# Patient Record
Sex: Female | Born: 1990 | Race: Black or African American | Hispanic: No | Marital: Single | Smoking: Current every day smoker
Health system: Southern US, Community
[De-identification: ages and names within clinical notes are randomized; demographics above are authoritative.]

## PROBLEM LIST (undated history)

## (undated) DIAGNOSIS — F32A Depression, unspecified: Secondary | ICD-10-CM

## (undated) DIAGNOSIS — J45909 Unspecified asthma, uncomplicated: Secondary | ICD-10-CM

## (undated) DIAGNOSIS — F419 Anxiety disorder, unspecified: Secondary | ICD-10-CM

## (undated) DIAGNOSIS — I10 Essential (primary) hypertension: Secondary | ICD-10-CM

## (undated) HISTORY — DX: Anxiety disorder, unspecified: F41.9

## (undated) HISTORY — DX: Depression, unspecified: F32.A

## (undated) HISTORY — PX: TONSILLECTOMY: SUR1361

---
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
## 2010-11-19 ENCOUNTER — Emergency Department (HOSPITAL_COMMUNITY)
Admission: EM | Admit: 2010-11-19 | Discharge: 2010-11-19 | Disposition: A | Discharge status: Home / Self Care | Payer: Self-pay | Attending: Emergency Medicine | Admitting: Emergency Medicine

## 2010-11-19 DIAGNOSIS — H53149 Visual discomfort, unspecified: Secondary | ICD-10-CM | POA: Insufficient documentation

## 2010-11-19 DIAGNOSIS — R112 Nausea with vomiting, unspecified: Secondary | ICD-10-CM | POA: Insufficient documentation

## 2010-11-19 DIAGNOSIS — R51 Headache: Secondary | ICD-10-CM | POA: Insufficient documentation

## 2010-11-27 ENCOUNTER — Emergency Department (HOSPITAL_COMMUNITY)
Admission: EM | Admit: 2010-11-27 | Discharge: 2010-11-27 | Disposition: A | Discharge status: Home / Self Care | Payer: Self-pay | Attending: Emergency Medicine | Admitting: Emergency Medicine

## 2010-11-27 DIAGNOSIS — M542 Cervicalgia: Secondary | ICD-10-CM | POA: Insufficient documentation

## 2010-11-27 DIAGNOSIS — M436 Torticollis: Secondary | ICD-10-CM | POA: Insufficient documentation

## 2010-11-27 MED ORDER — METAXALONE 800 MG PO TABS: 800.0000 mg | ORAL_TABLET | Freq: Three times a day (TID) | ORAL | Status: AC

## 2010-11-27 MED ORDER — PREDNISONE 10 MG PO TABS: ORAL_TABLET | ORAL | Status: DC

## 2010-11-27 MED ORDER — HYDROCODONE-ACETAMINOPHEN 5-500 MG PO TABS: 1.0000 | ORAL_TABLET | ORAL | Status: AC | PRN

## 2010-11-27 NOTE — Progress Notes (Signed)
Pt driving. Rx given.

## 2010-11-27 NOTE — ED Notes (Signed)
Pt reports waking a week ago with a stiffness and spasms in her neck that is not getting any better.  nad noted

## 2010-11-27 NOTE — ED Notes (Signed)
Pt presents with stiff neck and neck pain. Pt states pain is on both sides of neck and pt is unable to turn neck. Pt denies trauma. Pt has tried home remedies with no relief.

## 2010-11-27 NOTE — ED Provider Notes (Signed)
History     Chief Complaint  Patient presents with  . Torticollis   Patient is a 20 y.o. female presenting with neck injury.  Neck Injury This is a new problem. The current episode started in the past 7 days. The problem occurs constantly. The problem has been unchanged. Associated symptoms include neck pain. Pertinent negatives include no abdominal pain, arthralgias, chest pain, coughing, fever, myalgias or numbness. The symptoms are aggravated by twisting. She has tried nothing for the symptoms. The treatment provided no relief.    History reviewed. No pertinent past medical history.  Past Surgical History  Procedure Date  . Tonsillectomy     History reviewed. No pertinent family history.  History  Substance Use Topics  . Smoking status: Current Everyday Smoker -- 0.5 packs/day    Types: Cigarettes  . Smokeless tobacco: Not on file  . Alcohol Use: Yes    OB History    Grav Para Term Preterm Abortions TAB SAB Ect Mult Living   0               Review of Systems  Constitutional: Negative for fever and activity change.       All ROS Neg except as noted in HPI  HENT: Positive for neck pain. Negative for nosebleeds.   Eyes: Negative for photophobia and discharge.  Respiratory: Negative for cough, shortness of breath and wheezing.   Cardiovascular: Negative for chest pain and palpitations.  Gastrointestinal: Negative for abdominal pain and blood in stool.  Genitourinary: Negative for dysuria, frequency and hematuria.  Musculoskeletal: Negative for myalgias, back pain and arthralgias.  Skin: Negative.   Neurological: Negative for dizziness, seizures, speech difficulty and numbness.  Psychiatric/Behavioral: Negative for hallucinations and confusion.    Physical Exam  BP 154/85  Pulse 77  Temp(Src) 98.7 F (37.1 C) (Oral)  Resp 16  Ht 5\' 4"  (1.626 m)  Wt 209 lb (94.802 kg)  BMI 35.87 kg/m2  SpO2 99%  LMP 11/07/2010  Physical Exam  Nursing note and vitals  reviewed. Constitutional: She is oriented to person, place, and time. She appears well-developed and well-nourished.  Non-toxic appearance.  HENT:  Head: Normocephalic.  Right Ear: Tympanic membrane and external ear normal.  Left Ear: Tympanic membrane and external ear normal.  Eyes: EOM and lids are normal. Pupils are equal, round, and reactive to light.  Neck: Neck supple. Carotid bruit is not present.       Pt has decrease ROM of the neck due pain. Tightness of the trapezius muscles. No bruits. No lymphadenopathy.   Cardiovascular: Normal rate, regular rhythm, normal heart sounds, intact distal pulses and normal pulses.   Pulmonary/Chest: Breath sounds normal. No respiratory distress.  Abdominal: Soft. Bowel sounds are normal. There is no tenderness. There is no guarding.  Musculoskeletal: Normal range of motion.       Tightness of the shoulders with spasm on palpation.  Lymphadenopathy:       Head (right side): No submandibular adenopathy present.       Head (left side): No submandibular adenopathy present.    She has no cervical adenopathy.  Neurological: She is alert and oriented to person, place, and time. She has normal strength. No cranial nerve deficit or sensory deficit.  Skin: Skin is warm and dry.  Psychiatric: She has a normal mood and affect. Her speech is normal.    ED Course  Procedures  MDM I have reviewed nursing notes, vital signs, and all appropriate lab and imaging results for  this patient.      Kathie Dike, [place] 11/27/10 [id number]

## 2010-12-05 ENCOUNTER — Emergency Department (HOSPITAL_COMMUNITY)
Admission: EM | Admit: 2010-12-05 | Discharge: 2010-12-05 | Disposition: A | Discharge status: Home / Self Care | Payer: Self-pay | Attending: Emergency Medicine | Admitting: Emergency Medicine

## 2010-12-05 ENCOUNTER — Encounter (HOSPITAL_COMMUNITY): Payer: Self-pay | Admitting: *Deleted

## 2010-12-05 ENCOUNTER — Emergency Department (HOSPITAL_COMMUNITY): Payer: Self-pay

## 2010-12-05 DIAGNOSIS — R509 Fever, unspecified: Secondary | ICD-10-CM | POA: Insufficient documentation

## 2010-12-05 DIAGNOSIS — R1011 Right upper quadrant pain: Secondary | ICD-10-CM | POA: Insufficient documentation

## 2010-12-05 DIAGNOSIS — R7402 Elevation of levels of lactic acid dehydrogenase (LDH): Secondary | ICD-10-CM | POA: Insufficient documentation

## 2010-12-05 DIAGNOSIS — F17200 Nicotine dependence, unspecified, uncomplicated: Secondary | ICD-10-CM | POA: Insufficient documentation

## 2010-12-05 DIAGNOSIS — R112 Nausea with vomiting, unspecified: Secondary | ICD-10-CM | POA: Insufficient documentation

## 2010-12-05 DIAGNOSIS — R1084 Generalized abdominal pain: Secondary | ICD-10-CM

## 2010-12-05 DIAGNOSIS — R7401 Elevation of levels of liver transaminase levels: Secondary | ICD-10-CM | POA: Insufficient documentation

## 2010-12-05 DIAGNOSIS — R748 Abnormal levels of other serum enzymes: Secondary | ICD-10-CM

## 2010-12-05 LAB — CBC
MCH: 26.8 pg (ref 26.0–34.0)
MCV: 78.4 fL (ref 78.0–100.0)
Platelets: 175 10*3/uL (ref 150–400)
RDW: 13.2 % (ref 11.5–15.5)
WBC: 8.9 10*3/uL (ref 4.0–10.5)

## 2010-12-05 LAB — COMPREHENSIVE METABOLIC PANEL
AST: 54 U/L — ABNORMAL HIGH (ref 0–37)
Albumin: 3.6 g/dL (ref 3.5–5.2)
Calcium: 9.5 mg/dL (ref 8.4–10.5)
Creatinine, Ser: 0.6 mg/dL (ref 0.50–1.10)
Total Protein: 7.3 g/dL (ref 6.0–8.3)

## 2010-12-05 LAB — URINALYSIS, ROUTINE W REFLEX MICROSCOPIC
Nitrite: NEGATIVE
Specific Gravity, Urine: 1.01 (ref 1.005–1.030)
Urobilinogen, UA: 0.2 mg/dL (ref 0.0–1.0)

## 2010-12-05 LAB — URINE MICROSCOPIC-ADD ON

## 2010-12-05 MED ORDER — ONDANSETRON HCL 4 MG/2ML IJ SOLN
4.0000 mg | Freq: Once | INTRAMUSCULAR | Status: AC
  Administered 2010-12-05: 4 mg via INTRAVENOUS
  Filled 2010-12-05: qty 2

## 2010-12-05 MED ORDER — SODIUM CHLORIDE 0.9 % IV BOLUS (SEPSIS)
1000.0000 mL | Freq: Once | INTRAVENOUS | Status: AC
  Administered 2010-12-05: 1000 mL via INTRAVENOUS

## 2010-12-05 MED ORDER — HYDROCODONE-ACETAMINOPHEN 5-325 MG PO TABS: 1.0000 | ORAL_TABLET | Freq: Four times a day (QID) | ORAL | Status: AC | PRN

## 2010-12-05 MED ORDER — MORPHINE SULFATE 4 MG/ML IJ SOLN
4.0000 mg | Freq: Once | INTRAMUSCULAR | Status: AC
  Administered 2010-12-05: 4 mg via INTRAVENOUS
  Filled 2010-12-05: qty 1

## 2010-12-05 MED ORDER — HYDROMORPHONE HCL 1 MG/ML IJ SOLN
1.0000 mg | Freq: Once | INTRAMUSCULAR | Status: AC
  Administered 2010-12-05: 1 mg via INTRAVENOUS
  Filled 2010-12-05: qty 1

## 2010-12-05 MED ORDER — SODIUM CHLORIDE 0.9 % IV SOLN
Freq: Once | INTRAVENOUS | Status: AC
  Administered 2010-12-05: 08:00:00 via INTRAVENOUS

## 2010-12-05 NOTE — ED Provider Notes (Signed)
History     Chief Complaint  Patient presents with  . Fever  . Abdominal Pain  . Nausea   Patient is a 20 y.o. female presenting with abdominal pain. The history is provided by the patient.  Abdominal Pain The primary symptoms of the illness include abdominal pain, fever, nausea and vomiting. The primary symptoms of the illness do not include shortness of breath, diarrhea, dysuria, vaginal discharge or vaginal bleeding. The current episode started yesterday. The onset of the illness was gradual.  The abdominal pain is located in the RUQ. The abdominal pain is relieved by nothing.  The maximum temperature recorded prior to her arrival was unknown.  Nausea began today.  The vomiting began today. Vomiting occurs 2 to 5 times per day. The emesis contains stomach contents.  Significant associated medical issues do not include gallstones.    History reviewed. No pertinent past medical history.  Past Surgical History  Procedure Date  . Tonsillectomy     Family History  Problem Relation Age of Onset  . Adopted: Yes    History  Substance Use Topics  . Smoking status: Current Everyday Smoker -- 0.5 packs/day    Types: Cigarettes  . Smokeless tobacco: Not on file  . Alcohol Use: Yes    OB History    Grav Para Term Preterm Abortions TAB SAB Ect Mult Living   0               Review of Systems  Constitutional: Positive for fever.  Respiratory: Negative for shortness of breath.   Gastrointestinal: Positive for nausea, vomiting and abdominal pain. Negative for diarrhea.  Genitourinary: Negative for dysuria, vaginal bleeding and vaginal discharge.  All other systems reviewed and are negative.    Physical Exam  BP 138/83  Pulse 95  Temp(Src) 100.8 F (38.2 C) (Oral)  Resp 16  Ht 5\' 4"  (1.626 m)  Wt 209 lb (94.802 kg)  BMI 35.87 kg/m2  SpO2 97%  LMP 11/07/2010  Physical Exam  Nursing note and vitals reviewed. Constitutional: She is oriented to person, place, and time.  She appears well-developed and well-nourished. No distress.  HENT:  Head: Normocephalic and atraumatic.  Eyes: EOM are normal.  Neck: Normal range of motion.  Cardiovascular: Normal rate, regular rhythm and normal heart sounds.   Pulmonary/Chest: Effort normal and breath sounds normal.  Abdominal: Soft. She exhibits no distension.       Tenderness in right upper quadrant without guarding . No RLQ tenderness   Musculoskeletal: Normal range of motion.  Neurological: She is alert and oriented to person, place, and time.  Skin: Skin is warm and dry.  Psychiatric: She has a normal mood and affect. Judgment normal.    ED Course  Procedures  MDM   Results for orders placed during the hospital encounter of 12/05/10  PREGNANCY, URINE      Component Value Range   Preg Test, Ur NEGATIVE    URINALYSIS, ROUTINE W REFLEX MICROSCOPIC      Component Value Range   Color, Urine YELLOW  YELLOW    Appearance CLEAR  CLEAR    Specific Gravity, Urine 1.010  1.005 - 1.030    pH 6.5  5.0 - 8.0    Glucose, UA NEGATIVE  NEGATIVE (mg/dL)   Hgb urine dipstick TRACE (*) NEGATIVE    Bilirubin Urine NEGATIVE  NEGATIVE    Ketones, ur NEGATIVE  NEGATIVE (mg/dL)   Protein, ur NEGATIVE  NEGATIVE (mg/dL)   Urobilinogen, UA 0.2  0.0 - 1.0 (mg/dL)   Nitrite NEGATIVE  NEGATIVE    Leukocytes, UA NEGATIVE  NEGATIVE   URINE MICROSCOPIC-ADD ON      Component Value Range   Squamous Epithelial / LPF RARE  RARE    WBC, UA 0-2  <3 (WBC/hpf)   RBC / HPF 0-2  <3 (RBC/hpf)   Bacteria, UA RARE  RARE   CBC      Component Value Range   WBC 8.9  4.0 - 10.5 (K/uL)   RBC 4.85  3.87 - 5.11 (MIL/uL)   Hemoglobin 13.0  12.0 - 15.0 (g/dL)   HCT 16.1  09.6 - 04.5 (%)   MCV 78.4  78.0 - 100.0 (fL)   MCH 26.8  26.0 - 34.0 (pg)   MCHC 34.2  30.0 - 36.0 (g/dL)   RDW 40.9  81.1 - 91.4 (%)   Platelets 175  150 - 400 (K/uL)  COMPREHENSIVE METABOLIC PANEL      Component Value Range   Sodium 135  135 - 145 (mEq/L)   Potassium  3.7  3.5 - 5.1 (mEq/L)   Chloride 101  96 - 112 (mEq/L)   CO2 26  19 - 32 (mEq/L)   Glucose, Bld 107 (*) 70 - 99 (mg/dL)   BUN 9  6 - 23 (mg/dL)   Creatinine, Ser 7.82  0.50 - 1.10 (mg/dL)   Calcium 9.5  8.4 - 95.6 (mg/dL)   Total Protein 7.3  6.0 - 8.3 (g/dL)   Albumin 3.6  3.5 - 5.2 (g/dL)   AST 54 (*) 0 - 37 (U/L)   ALT 82 (*) 0 - 35 (U/L)   Alkaline Phosphatase 165 (*) 39 - 117 (U/L)   Total Bilirubin 0.4  0.3 - 1.2 (mg/dL)   GFR calc non Af Amer >60  >60 (mL/min)   GFR calc Af Amer >60  >60 (mL/min)  LIPASE, BLOOD      Component Value Range   Lipase 20  11 - 59 (U/L)   No results found.  8:43 AM Concern for GB pathology. Will obtain US to evaluate further. Mild elevation in LFTs. Pain and nausea treated. Care to Dr Estell Harpin.       Lyanne Co, MD 12/05/10 [phone]

## 2010-12-05 NOTE — ED Notes (Signed)
Pt resting on stretcher. Still c/o pain 9/10 in rt lower abdomen and in shoulders. IV infusing with no edema or redness. Alert and oriented x 3. Skin warm and dry. Color pink. Breath sounds clear and equal bilaterally. Abdomen soft and nondistended.

## 2010-12-05 NOTE — ED Notes (Signed)
Pt c/o fever x 24hrs and right sided abdominal pain. Pt took advil around 8pm last night.

## 2010-12-05 NOTE — ED Notes (Signed)
Pt dozing. States that the pain is tolerable right now. IV infusing with no edema or redness. Back from x-ray. Awaiting results.

## 2010-12-05 NOTE — ED Notes (Signed)
IV infusing with no edema or redness. Pt states that her pain is better as long as she lies still. Awaiting [place].

## 2010-12-05 NOTE — ED Provider Notes (Signed)
History     Chief Complaint  Patient presents with  . Fever  . Abdominal Pain  . Nausea   HPI  History reviewed. No pertinent past medical history.  Past Surgical History  Procedure Date  . Tonsillectomy     Family History  Problem Relation Age of Onset  . Adopted: Yes    History  Substance Use Topics  . Smoking status: Current Everyday Smoker -- 0.5 packs/day    Types: Cigarettes  . Smokeless tobacco: Not on file  . Alcohol Use: Yes    OB History    Grav Para Term Preterm Abortions TAB SAB Ect Mult Living   0               Review of Systems  Physical Exam  BP 145/85  Pulse 97  Temp(Src) 98.6 F (37 C) (Oral)  Resp 19  Ht 5\' 4"  (1.626 m)  Wt 209 lb (94.802 kg)  BMI 35.87 kg/m2  SpO2 95%  LMP 11/07/2010  Physical Exam  ED Course  Procedures Results for orders placed during the hospital encounter of 12/05/10  PREGNANCY, URINE      Component Value Range   Preg Test, Ur NEGATIVE    URINALYSIS, ROUTINE W REFLEX MICROSCOPIC      Component Value Range   Color, Urine YELLOW  YELLOW    Appearance CLEAR  CLEAR    Specific Gravity, Urine 1.010  1.005 - 1.030    pH 6.5  5.0 - 8.0    Glucose, UA NEGATIVE  NEGATIVE (mg/dL)   Hgb urine dipstick TRACE (*) NEGATIVE    Bilirubin Urine NEGATIVE  NEGATIVE    Ketones, ur NEGATIVE  NEGATIVE (mg/dL)   Protein, ur NEGATIVE  NEGATIVE (mg/dL)   Urobilinogen, UA 0.2  0.0 - 1.0 (mg/dL)   Nitrite NEGATIVE  NEGATIVE    Leukocytes, UA NEGATIVE  NEGATIVE   URINE MICROSCOPIC-ADD ON      Component Value Range   Squamous Epithelial / LPF RARE  RARE    WBC, UA 0-2  <3 (WBC/hpf)   RBC / HPF 0-2  <3 (RBC/hpf)   Bacteria, UA RARE  RARE   CBC      Component Value Range   WBC 8.9  4.0 - 10.5 (K/uL)   RBC 4.85  3.87 - 5.11 (MIL/uL)   Hemoglobin 13.0  12.0 - 15.0 (g/dL)   HCT 45.4  09.8 - 11.9 (%)   MCV 78.4  78.0 - 100.0 (fL)   MCH 26.8  26.0 - 34.0 (pg)   MCHC 34.2  30.0 - 36.0 (g/dL)   RDW 14.7  82.9 - 56.2 (%)   Platelets 175  150 - 400 (K/uL)  COMPREHENSIVE METABOLIC PANEL      Component Value Range   Sodium 135  135 - 145 (mEq/L)   Potassium 3.7  3.5 - 5.1 (mEq/L)   Chloride 101  96 - 112 (mEq/L)   CO2 26  19 - 32 (mEq/L)   Glucose, Bld 107 (*) 70 - 99 (mg/dL)   BUN 9  6 - 23 (mg/dL)   Creatinine, Ser 1.30  0.50 - 1.10 (mg/dL)   Calcium 9.5  8.4 - 86.5 (mg/dL)   Total Protein 7.3  6.0 - 8.3 (g/dL)   Albumin 3.6  3.5 - 5.2 (g/dL)   AST 54 (*) 0 - 37 (U/L)   ALT 82 (*) 0 - 35 (U/L)   Alkaline Phosphatase 165 (*) 39 - 117 (U/L)   Total Bilirubin 0.4  0.3 - 1.2 (mg/dL)   GFR calc non Af Amer >60  >60 (mL/min)   GFR calc Af Amer >60  >60 (mL/min)  LIPASE, BLOOD      Component Value Range   Lipase 20  11 - 59 (U/L)   US Abdomen Complete  12/05/2010  *RADIOLOGY REPORT*  Clinical Data:  Right upper quadrant abdominal pain.  Nausea.  ABDOMINAL ULTRASOUND COMPLETE  Comparison:  None.  Findings:  Gallbladder:  No gallstones, gallbladder wall thickening, or pericholecystic fluid.  Common Bile Duct:  Within normal limits in caliber. Measures 5 mm in diameter.  Liver: No focal mass lesion identified.  Within normal limits in parenchymal echogenicity.  IVC:  Appears normal.  Pancreas:  No abnormality identified.  Spleen:  Within normal limits in size and echotexture.  Right kidney:  Normal in size and parenchymal echogenicity.  No evidence of mass or hydronephrosis.  Left kidney:  Normal in size and parenchymal echogenicity.  No evidence of mass or hydronephrosis.  Abdominal Aorta:  No aneurysm identified.  IMPRESSION: Negative abdominal ultrasound.  Original Report Authenticated By: Danae Orleans, M.D.     MDM Results discussed and plan.     Benny Lennert, MD 12/05/10 1058

## 2010-12-05 NOTE — ED Notes (Signed)
IV infusing with no edema or redness. Medicated IV for c/o pain.

## 2011-02-14 LAB — DIFFERENTIAL
Basophils Absolute: 0
Basophils Relative: 0
Eosinophils Absolute: 0.4
Eosinophils Relative: 5
Monocytes Absolute: 0.6
Monocytes Relative: 8
Neutro Abs: 5

## 2011-02-14 LAB — DRUGS OF ABUSE SCREEN W/O ALC, ROUTINE URINE
Barbiturate Quant, Ur: NEGATIVE
Creatinine,U: 116.8
Marijuana Metabolite: NEGATIVE
Methadone: NEGATIVE

## 2011-02-14 LAB — RPR: RPR Ser Ql: NONREACTIVE

## 2011-02-14 LAB — BASIC METABOLIC PANEL
CO2: 27
Calcium: 9.5
Chloride: 104
Glucose, Bld: 79
Sodium: 138

## 2011-02-14 LAB — HEPATIC FUNCTION PANEL
ALT: 15
Alkaline Phosphatase: 84
Bilirubin, Direct: 0.1
Indirect Bilirubin: 0.6
Total Bilirubin: 0.7

## 2011-02-14 LAB — URINE MICROSCOPIC-ADD ON

## 2011-02-14 LAB — CBC
HCT: 36.6
Hemoglobin: 12.4
MCHC: 33.8
MCV: 79.3
RDW: 12.4

## 2011-02-14 LAB — TSH: TSH: 1.422

## 2011-02-14 LAB — URINALYSIS, ROUTINE W REFLEX MICROSCOPIC
Glucose, UA: NEGATIVE
Nitrite: NEGATIVE
Protein, ur: NEGATIVE
pH: 6

## 2011-03-14 ENCOUNTER — Emergency Department: Payer: Self-pay | Admitting: Unknown Physician Specialty

## 2011-04-08 ENCOUNTER — Emergency Department: Payer: Self-pay | Admitting: Emergency Medicine

## 2011-09-28 ENCOUNTER — Observation Stay: Payer: Self-pay | Admitting: Internal Medicine

## 2011-10-09 ENCOUNTER — Observation Stay: Payer: Self-pay | Admitting: Obstetrics and Gynecology

## 2011-10-15 ENCOUNTER — Observation Stay: Payer: Self-pay | Admitting: Advanced Practice Midwife

## 2011-10-16 ENCOUNTER — Inpatient Hospital Stay: Payer: Self-pay | Admitting: Obstetrics & Gynecology

## 2011-10-16 LAB — DRUG SCREEN, URINE
Barbiturates, Ur Screen: NEGATIVE (ref ?–200)
Benzodiazepine, Ur Scrn: NEGATIVE (ref ?–200)
MDMA (Ecstasy)Ur Screen: NEGATIVE (ref ?–500)
Methadone, Ur Screen: NEGATIVE (ref ?–300)
Opiate, Ur Screen: NEGATIVE (ref ?–300)

## 2011-10-16 LAB — CBC WITH DIFFERENTIAL/PLATELET
Basophil #: 0 10*3/uL (ref 0.0–0.1)
Basophil %: 0.2 %
Eosinophil %: 1.7 %
HGB: 11.3 g/dL — ABNORMAL LOW (ref 12.0–16.0)
Lymphocyte #: 1.9 10*3/uL (ref 1.0–3.6)
Lymphocyte %: 14 %
MCHC: 34 g/dL (ref 32.0–36.0)
Monocyte #: 0.7 x10 3/mm (ref 0.2–0.9)
Monocyte %: 4.7 %
Neutrophil #: 10.9 10*3/uL — ABNORMAL HIGH (ref 1.4–6.5)
Platelet: 371 10*3/uL (ref 150–440)
WBC: 13.7 10*3/uL — ABNORMAL HIGH (ref 3.6–11.0)

## 2011-12-17 IMAGING — US ABDOMEN
1 series · 14 of 25 positions shown · non-contrast
Comparison: None.

CLINICAL DATA: Right upper quadrant abdominal pain.  Nausea.

ABDOMINAL ULTRASOUND COMPLETE

[Series 1: abdomen · 0.28mm/px · 14 of 89 slices shown]
[im 1/89]
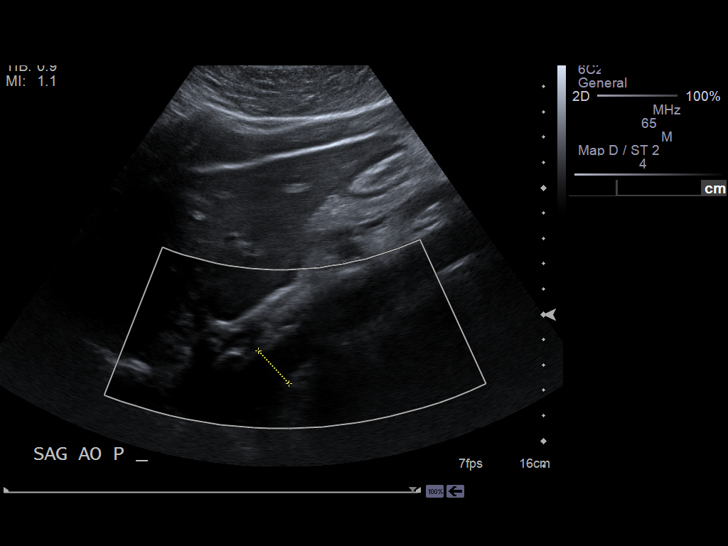
[im 8/89]
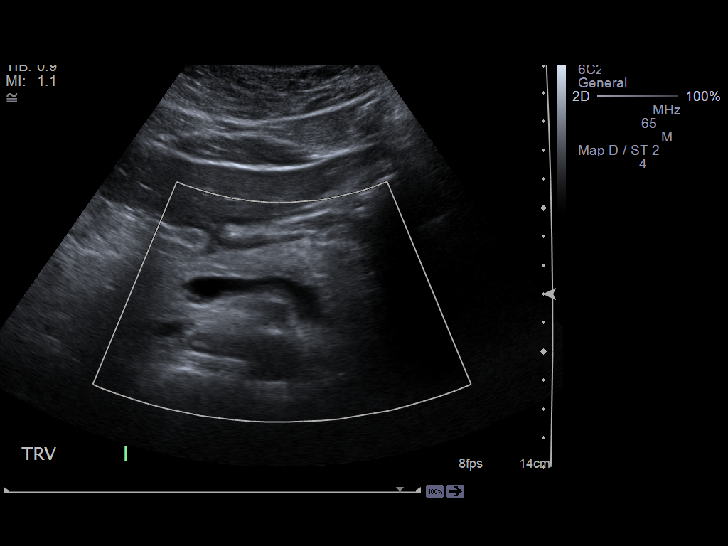
[im 15/89]
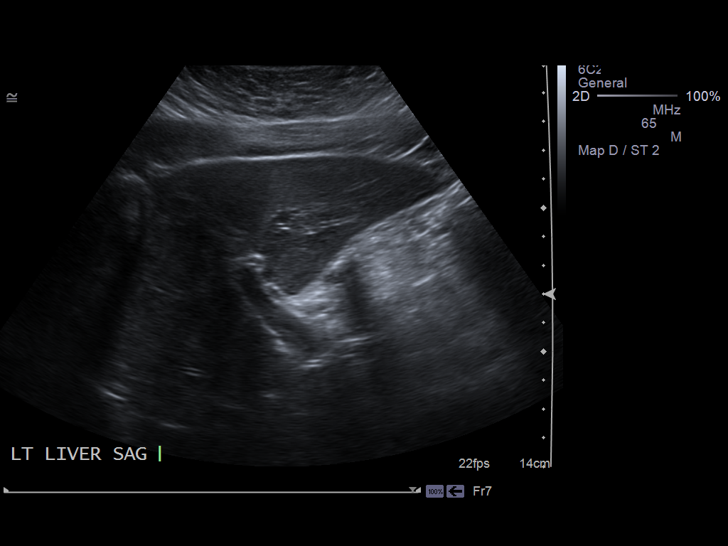
[im 23/89]
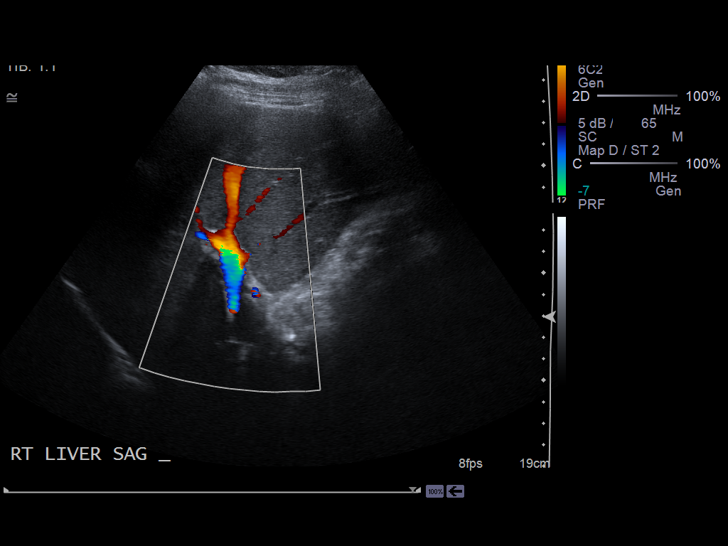
[im 30/89]
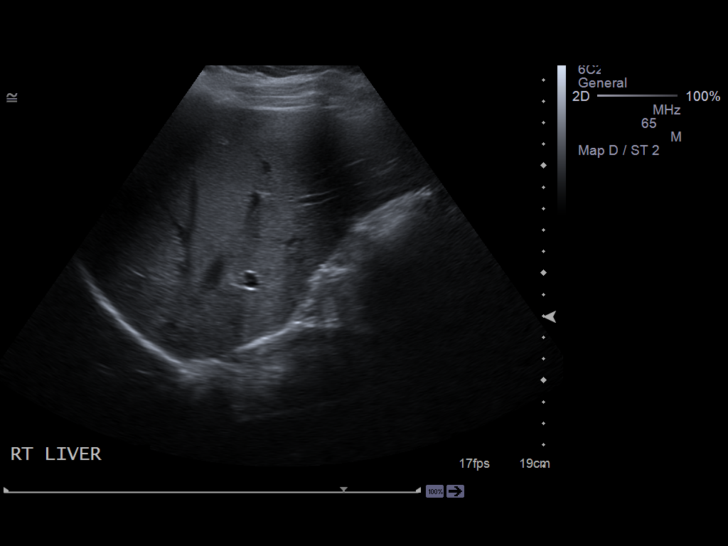
[im 34/89]
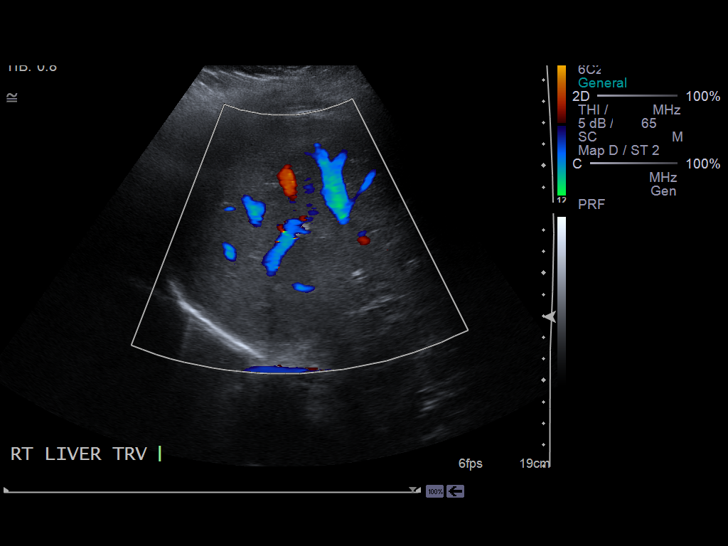
[im 41/89]
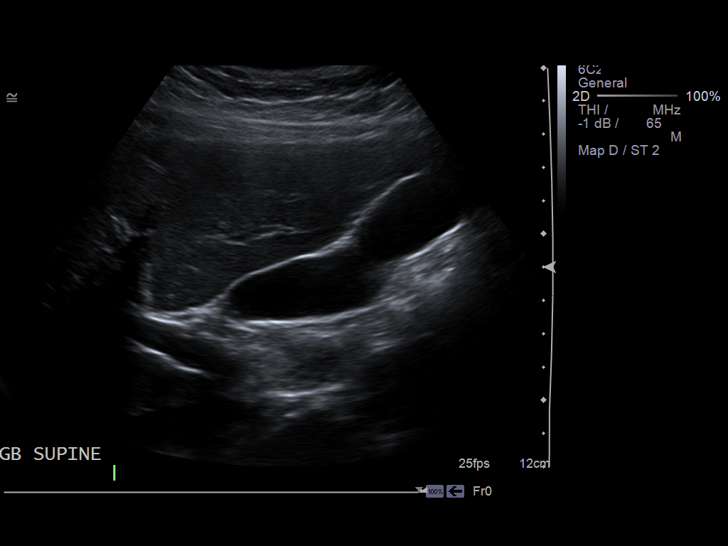
[im 48/89]
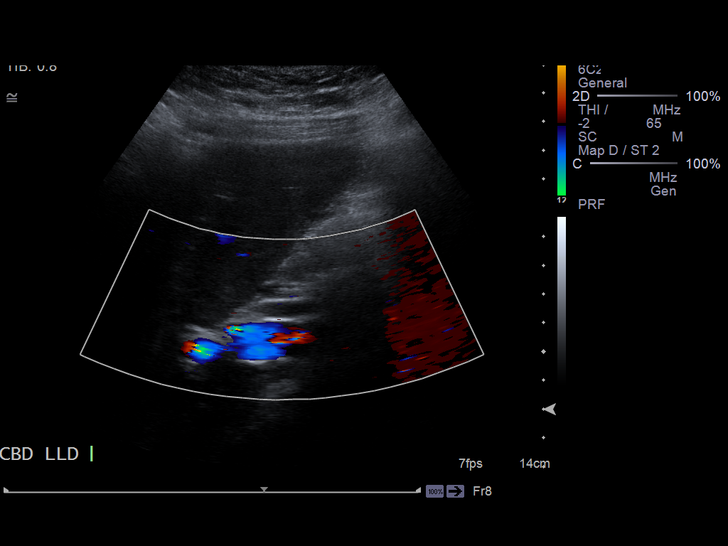
[im 56/89]
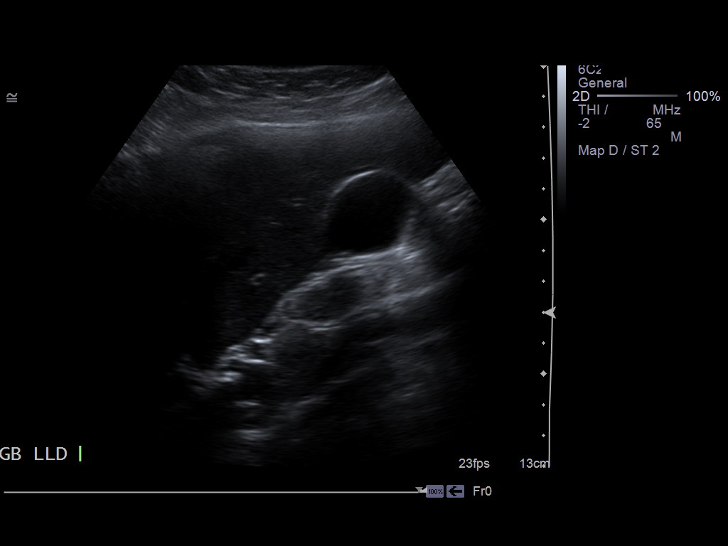
[im 59/89]
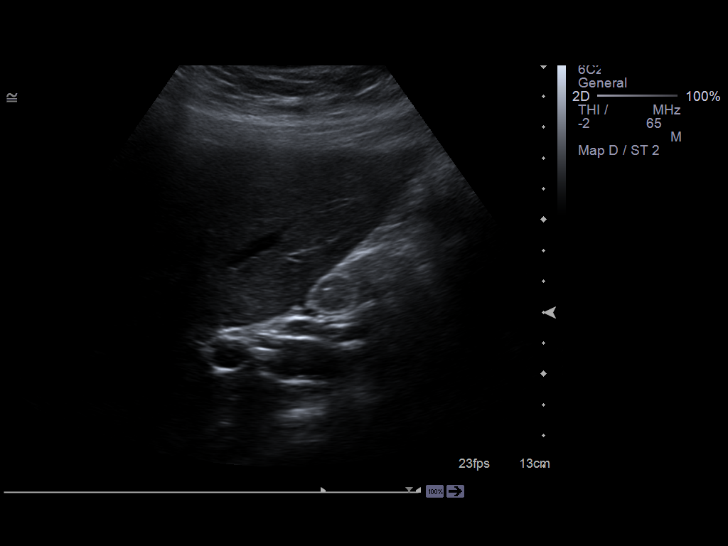
[im 67/89]
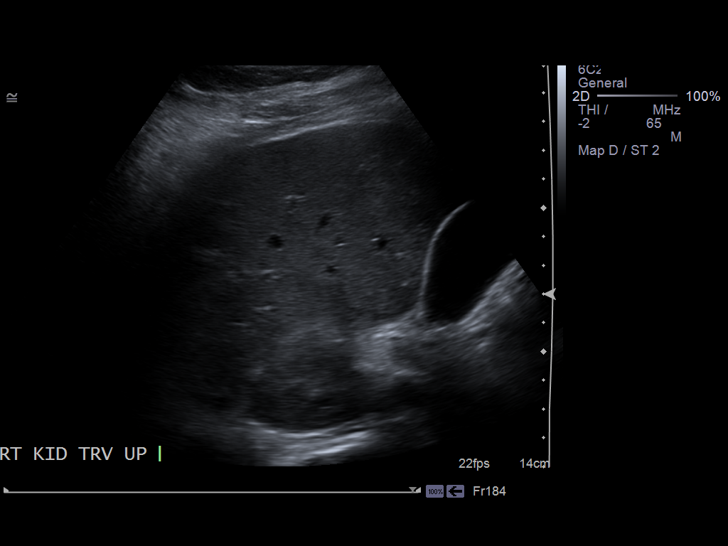
[im 74/89]
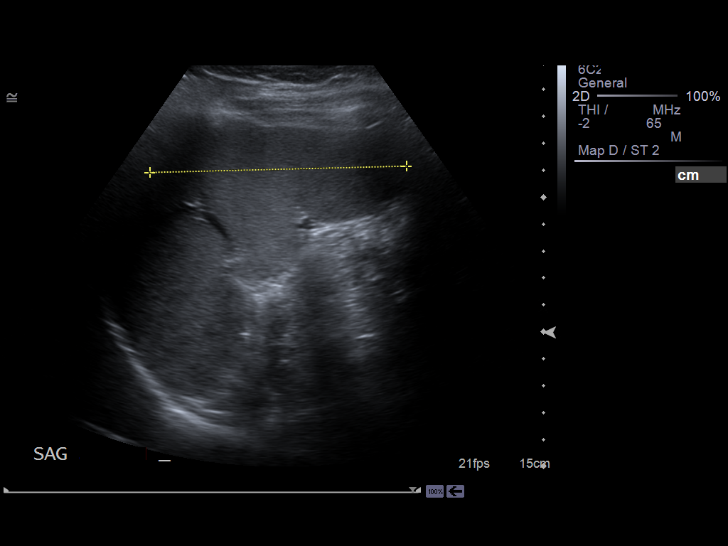
[im 81/89]
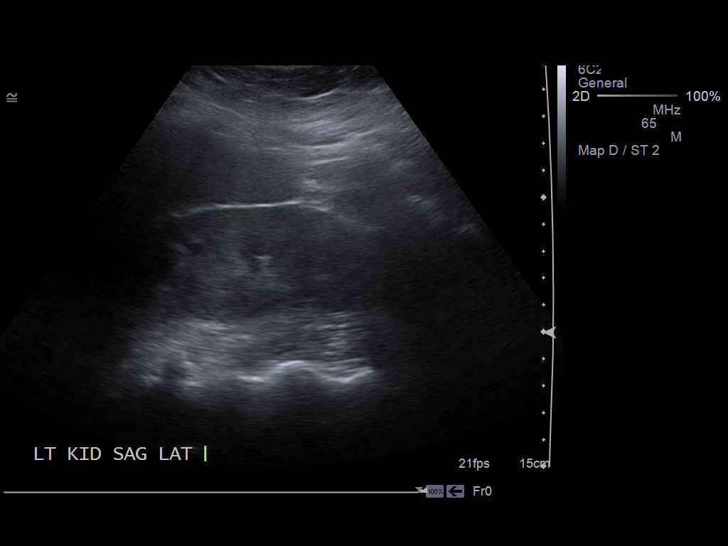
[im 89/89]
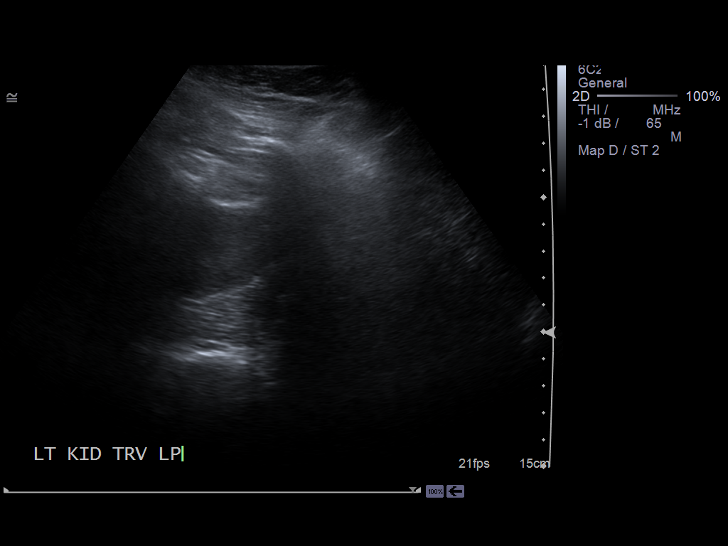

[14 of 25 positions shown; findings below may reference images not displayed]

FINDINGS: Gallbladder:  No gallstones, gallbladder wall thickening, or
pericholecystic fluid.

Common Bile Duct:  Within normal limits in caliber. Measures 5 mm
in diameter.

Liver: No focal mass lesion identified.  Within normal limits in
parenchymal echogenicity.

IVC:  Appears normal.

Pancreas:  No abnormality identified.

Spleen:  Within normal limits in size and echotexture.

Right kidney:  Normal in size and parenchymal echogenicity.  No
evidence of mass or hydronephrosis.

Left kidney:  Normal in size and parenchymal echogenicity.  No
evidence of mass or hydronephrosis.

Abdominal Aorta:  No aneurysm identified.
IMPRESSION: Negative abdominal ultrasound.

## 2012-04-24 IMAGING — US US OB < 14 WEEKS - US OB TV
1 series · 17 of 28 positions shown · non-contrast
Comparison: none

REASON FOR EXAM: Abdominal pain, vaginal bleeding
COMMENTS:   May transport without cardiac monitor

PROCEDURE:     US  - US OB LESS THAN 14 WEEKS/W TRANS  - March 14, 2011  [DATE]
RESULT:     Comparison: None
TECHNIQUE: Multiple transabdominal gray-scale images and endovaginal
gray-scale images of the pelvis performed.

[Series 1: us ob < 14 weeks - us ob tv · 17 of 76 slices shown]
[im 1/76]
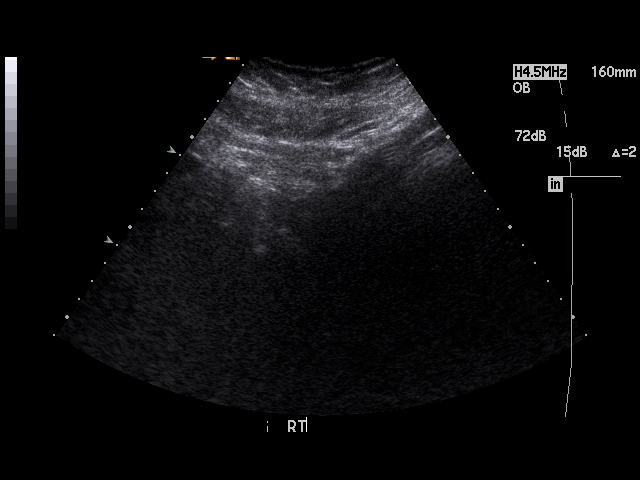
[im 6/76]
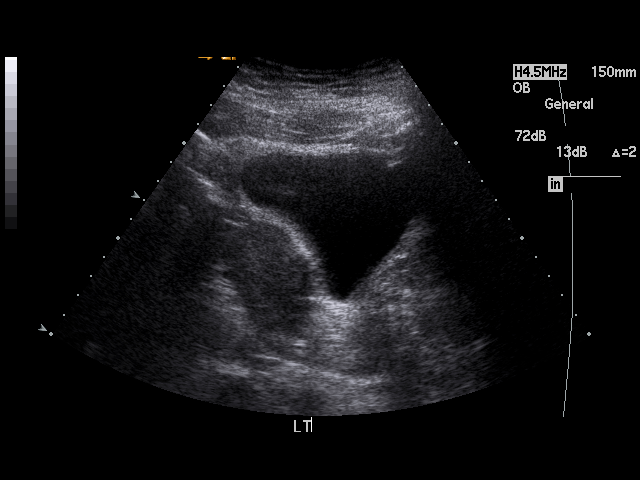
[im 12/76]
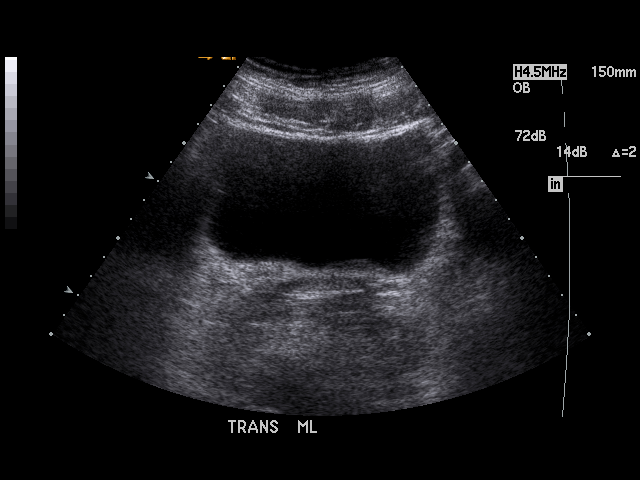
[im 14/76]
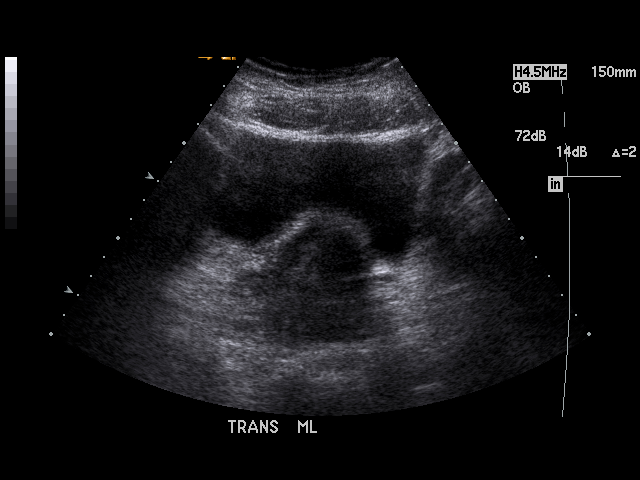
[im 20/76]
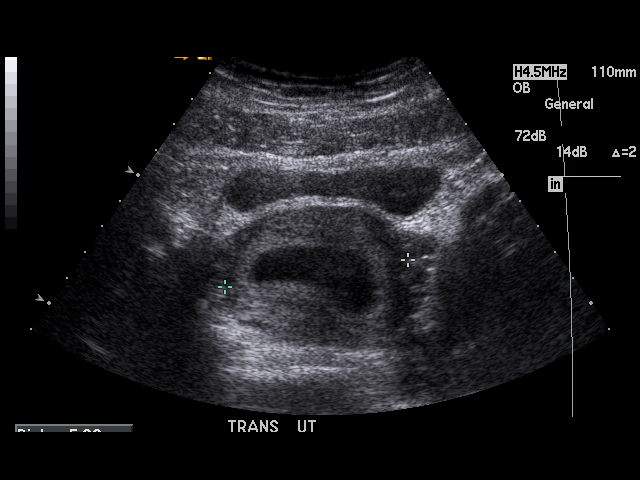
[im 26/76]
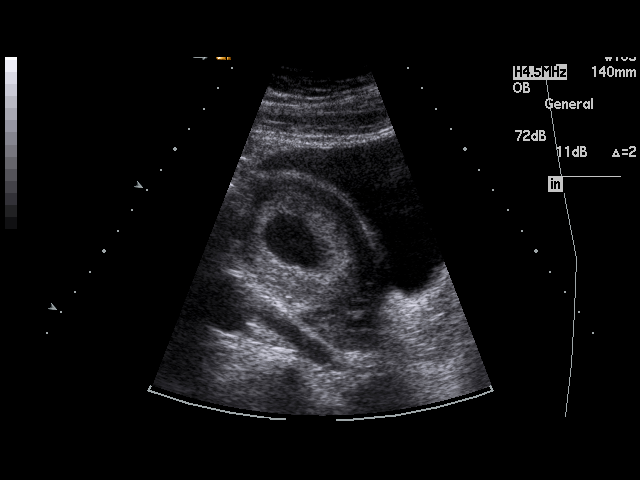
[im 28/76]
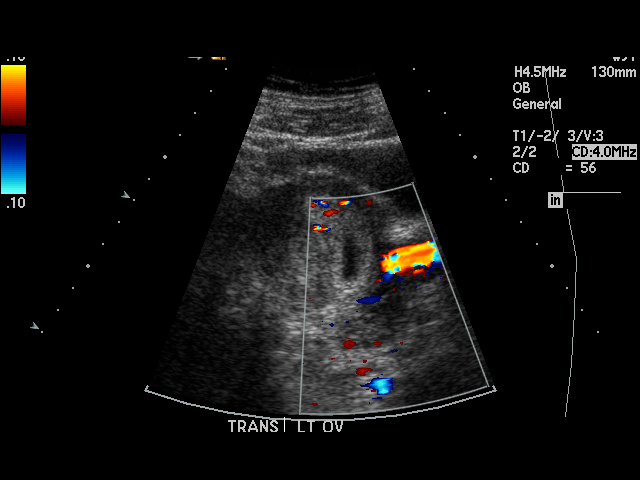
[im 34/76]
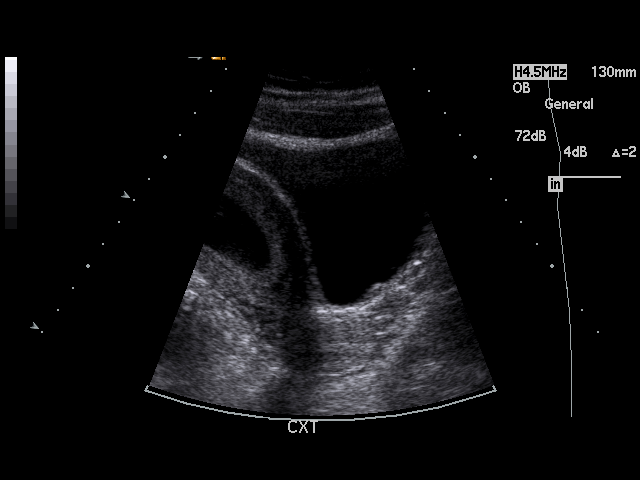
[im 39/76]
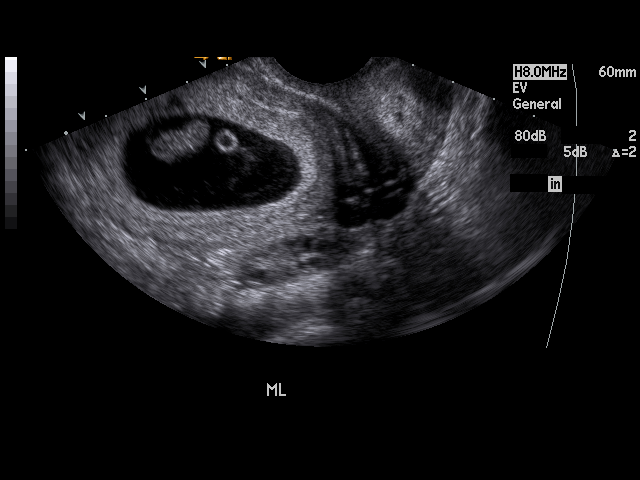
[im 42/76]
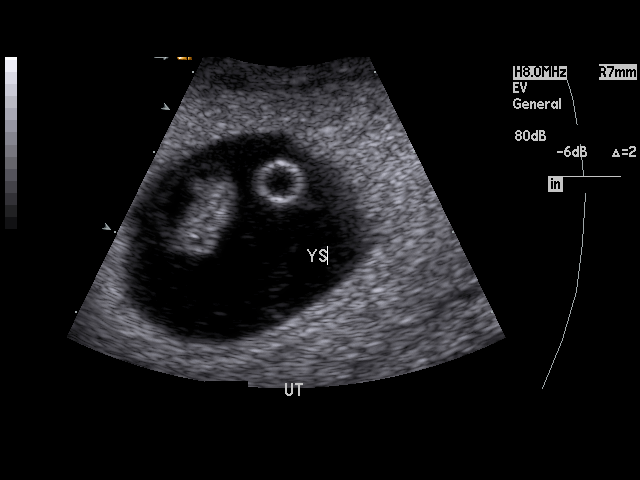
[im 48/76]
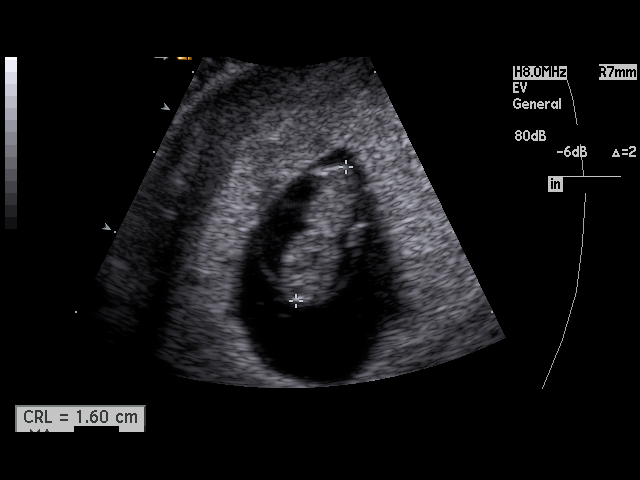
[im 51/76]
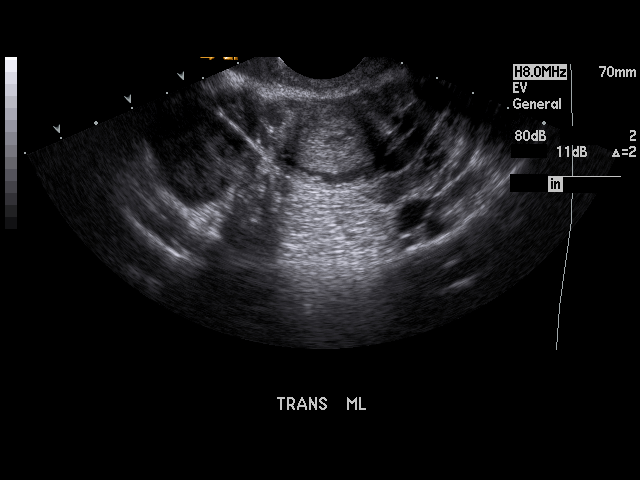
[im 56/76]
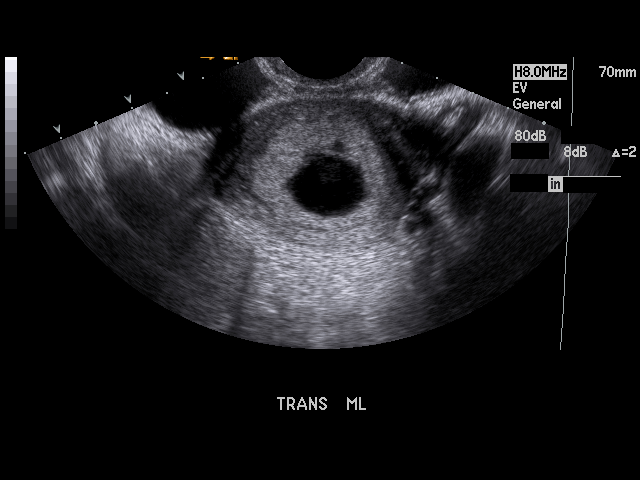
[im 62/76]
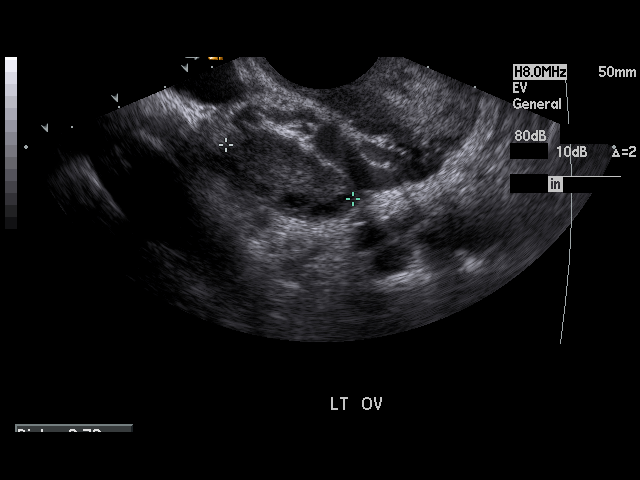
[im 64/76]
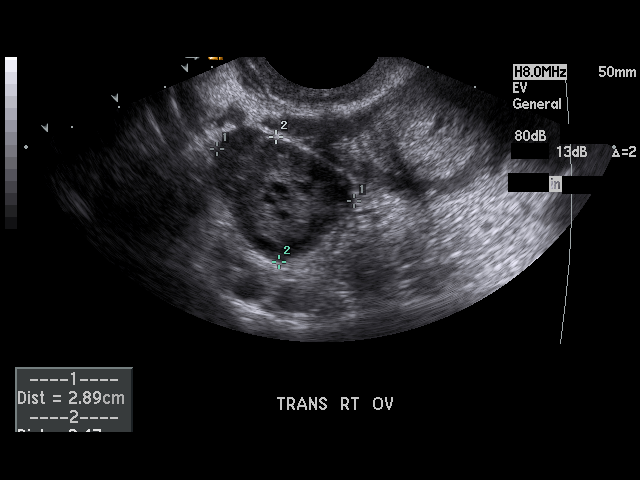
[im 70/76]
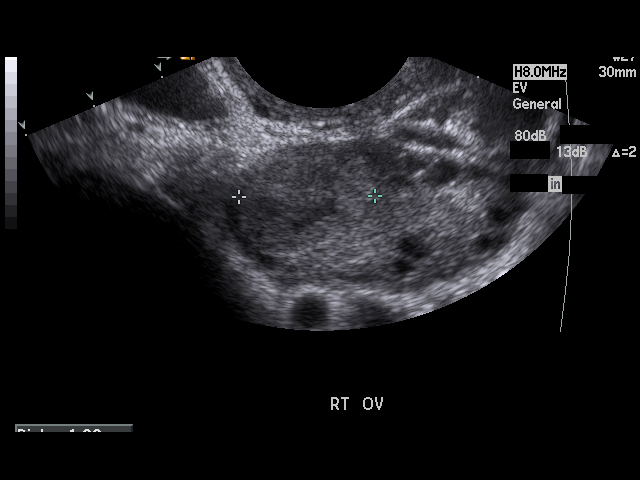
[im 76/76]
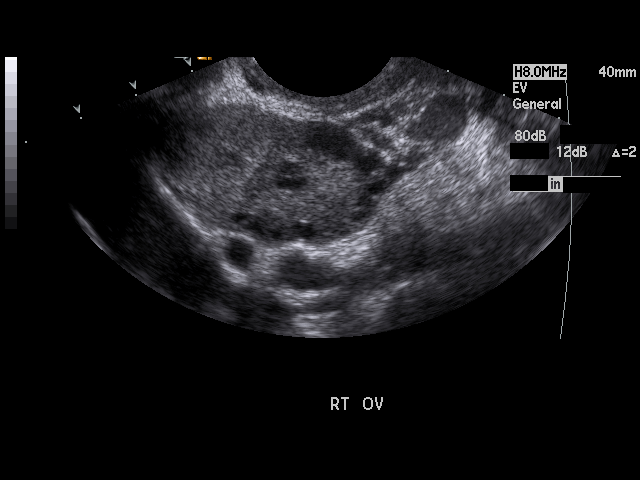

[17 of 28 positions shown; findings below may reference images not displayed]

FINDINGS: There is a single live intrauterine pregnancy visualized with a crown-rump
length of 1.59 cm dating the pregnancy at 8 weeks 0 days. There is a normal
yolk sac. There is a normal fetal heart rate of 167 beats per minute.

The right ovary measures 4.1 x 2.9 x 2.5 cm.  The left ovary measures 2.7 x
3.8 x 1.3 cm.  There is a 2.2 cm complex hypoechoic mass in the right ovary
likely resenting a corpus luteum cyst of pregnancy.

There is no pelvic free fluid.
IMPRESSION: Single live intrauterine pregnancy dating 8 weeks 0 days with an estimated
due date of 10/24/2011.

## 2013-05-09 DIAGNOSIS — O169 Unspecified maternal hypertension, unspecified trimester: Secondary | ICD-10-CM

## 2014-08-26 ENCOUNTER — Emergency Department
Admit: 2014-08-26 | Disposition: A | Discharge status: Home / Self Care | Payer: Self-pay | Admitting: Emergency Medicine

## 2014-09-23 NOTE — H&P (Signed)
L&D Evaluation:  History Expanded:   HPI 24 yo G1P0 [facility] and [facility]. SHe is 38 weeks 1 day. she is on Buspar 15 mg and PNV she is Varicella equivocal.She has borderline personality diosorder. and major depression. tubal papers have been signed but pt is not 21 yet and so the papers are invalid.    Gravida 1    Term 0    PreTerm 0    Abortion 0    Living 0    Blood Type A positive    Group B Strep Results (Result >5wks must be treated as unknown) unknown/result > 5 weeks ago  done today    Maternal HIV Negative    Maternal Syphilis Ab Nonreactive    Maternal Varicella Equivocal    Rubella Results immune    Maternal T-Dap Immune    [facility]    Presents with contractions    Patient's Medical History borderline personality disorder, major depression    Patient's Surgical History none    Medications Pre Natal Vitamins  buspar    Allergies NKDA    Social History tobacco    Family History Non-Contributory   ROS:   ROS All systems were reviewed.  HEENT, CNS, GI, GU, Respiratory, CV, Renal and Musculoskeletal systems were found to be normal.   Exam:   Vital Signs stable    General no apparent distress    Mental Status clear    Chest clear    Heart normal sinus rhythm    Abdomen gravid, non-tender    Estimated Fetal Weight Average for gestational age    Back no CVAT    Pelvic no external lesions, FT thick,    Mebranes Intact    FHT normal rate with no decels    FHT Description strictly reactive occasional contractions no decels or variabnles    Fetal Heart Rate 145    Ucx irregular    Skin dry   Impression:   Impression early labor   Plan:   Plan monitor contractions and for cervical change    Comments latent labor dc home with sleeping pill, needs to establish care in area ASAP    Follow Up Appointment need to schedule   Electronic Signatures: Adria DevonKlett,  Merric Yost (MD)  (Signed 26-May-13 22:15)  Authored: L&D Evaluation   Last Updated: 26-May-13 22:15 by Adria DevonKlett, Gerardo Caiazzo (MD)

## 2014-09-23 NOTE — H&P (Signed)
L&D Evaluation:  History:   HPI 24 year old G1P0 presents to L&D with c/o contractions. 36 weeks 5 days today with EDD 10/22/11. Pt has only had one visit to Ob/Gyn office in [place] 2 weeks ago, states didn't have Medicaid in order so [facility]. Hasn't been back to office for another visit and was "no show" to her appt on Tues due to transportation issues. Pt came here stating she has been feel abd pain/ ctx's for 4-5 hours. She did get her labs done, glucola, and anatomy scan done at her first visit. EDD per LMP 10/22/11, EDD per 34 week U/S 10/30/11.  Labs: A Pos, RI, VZ nonimmune, glucola 89, H&H 10.3/31.6, RPR NR, Hep B and HIV Neg.  Anatomy scan was incomplete due to fetal lie and gestation    Presents with abdominal pain    Patient's Medical History depression/anxiety/PTSD    Patient's Surgical History other  tonsillectomy    Medications no meds, was taking Buspar but quit    Allergies NKDA    Social History none    Family History Non-Contributory   ROS:   ROS All systems were reviewed.  HEENT, CNS, GI, GU, Respiratory, CV, Renal and Musculoskeletal systems were found to be normal.   Exam:   Vital Signs stable    Urine Protein negative dipstick    General no apparent distress    Mental Status clear    Chest clear    Abdomen gravid, non-tender    Estimated Fetal Weight Average for gestational age    Back no CVAT    Edema no edema    Pelvic no external lesions, cervix closed and thick    Mebranes Intact    FHT normal rate with no decels    Ucx absent, some irritability    Skin dry   Impression:   Impression reactive NST, 36 5/7 weeks, normal discomforts of pregnancy   Plan:   Plan EFM/NST, monitor contractions and for cervical change, discharge    Comments Expressed importance of following up with Ob/Gyn in [place], pt states transportation issues but mother should be able to provide transportation. Labor precautions given, kick count  instructions, told to return for ROM, regular ctx's, heavy bleeding or any other concerns. Pt verbalized understanding.   Electronic Signatures: Shella Maximutnam, Avry Roedl (CNM)  (Signed 15-May-13 22:14)  Authored: L&D Evaluation   Last Updated: 15-May-13 22:14 by Shella MaximPutnam, Beola Vasallo (CNM)
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
## 2014-10-04 ENCOUNTER — Emergency Department: Payer: Medicaid Other

## 2014-10-04 DIAGNOSIS — Y998 Other external cause status: Secondary | ICD-10-CM | POA: Insufficient documentation

## 2014-10-04 DIAGNOSIS — Z880 Allergy status to penicillin: Secondary | ICD-10-CM | POA: Diagnosis not present

## 2014-10-04 DIAGNOSIS — Y9389 Activity, other specified: Secondary | ICD-10-CM | POA: Insufficient documentation

## 2014-10-04 DIAGNOSIS — W108XXA Fall (on) (from) other stairs and steps, initial encounter: Secondary | ICD-10-CM | POA: Diagnosis not present

## 2014-10-04 DIAGNOSIS — S92421A Displaced fracture of distal phalanx of right great toe, initial encounter for closed fracture: Secondary | ICD-10-CM | POA: Insufficient documentation

## 2014-10-04 DIAGNOSIS — Z7952 Long term (current) use of systemic steroids: Secondary | ICD-10-CM | POA: Insufficient documentation

## 2014-10-04 DIAGNOSIS — Z720 Tobacco use: Secondary | ICD-10-CM | POA: Diagnosis not present

## 2014-10-04 DIAGNOSIS — Y9289 Other specified places as the place of occurrence of the external cause: Secondary | ICD-10-CM | POA: Insufficient documentation

## 2014-10-04 DIAGNOSIS — S99921A Unspecified injury of right foot, initial encounter: Secondary | ICD-10-CM | POA: Diagnosis present

## 2014-10-04 MED ORDER — IBUPROFEN 800 MG PO TABS
ORAL_TABLET | ORAL | Status: AC
  Administered 2014-10-04: 800 mg via ORAL
  Filled 2014-10-04: qty 1

## 2014-10-04 MED ORDER — IBUPROFEN 800 MG PO TABS
800.0000 mg | ORAL_TABLET | Freq: Once | ORAL | Status: AC
  Administered 2014-10-04: 800 mg via ORAL

## 2014-10-04 NOTE — ED Notes (Signed)
Pt states she slipped on wood stairs outside today, pt reports she slid on her left side. Pt denies LOC, denies neck pain. Pt states right foot pain.

## 2014-10-05 ENCOUNTER — Emergency Department
Admission: EM | Admit: 2014-10-05 | Discharge: 2014-10-05 | Disposition: A | Discharge status: Home / Self Care | Payer: Medicaid Other | Attending: Emergency Medicine | Admitting: Emergency Medicine

## 2014-10-05 DIAGNOSIS — S92911A Unspecified fracture of right toe(s), initial encounter for closed fracture: Secondary | ICD-10-CM

## 2014-10-05 MED ORDER — OXYCODONE-ACETAMINOPHEN 5-325 MG PO TABS: 1.0000 | ORAL_TABLET | ORAL | Status: DC | PRN

## 2014-10-05 NOTE — Discharge Instructions (Signed)
Keep foot up if you are not walking on it. He can use ice 20 minutes every hour if you are not walking on 2 please do not fall asleep with ice on the toe is you can get burns that way where a stiff shoe wear the cast boot to keep the pressure off the toe was much as possible please follow-up with Dr. Trilby DrummerManns what his phone number elsewhere on the chart call on Monday for an appointment in about a week he can take a look at it and see how that's doing a turn for the worse pain and swelling numbness or any other problems thank you

## 2014-10-05 NOTE — ED Provider Notes (Signed)
[facility] Emergency Department Provider Note  ____________________________________________  Time seen: Approximately 5:07 AM  I have reviewed the triage vital signs and the nursing notes.   HISTORY  Chief Complaint Fall     HPI Paula Keller is a 24 y.o. female patient reports she fell down a step. Complains of a lot of pain in the right great toe she denies any other injuries this happened tonight   No past medical history on file.  There are no active problems to display for this patient.   Past Surgical History  Procedure Laterality Date  . Tonsillectomy      Current Outpatient Rx  Name  Route  Sig  Dispense  Refill  . predniSONE (DELTASONE) 10 MG tablet      6,5,4,3,2,1 - take with food Patient not taking: Reported on 10/05/2014   21 tablet   0     Allergies Amoxicillin  Family History  Problem Relation Age of Onset  . Adopted: Yes    Social History History  Substance Use Topics  . Smoking status: Current Every Day Smoker -- 0.50 packs/day    Types: Cigarettes  . Smokeless tobacco: Not on file  . Alcohol Use: Yes    Review of Systems  Constitutional: No fever/chills Eyes: No visual changes. ENT: No sore throat. Cardiovascular: Denies chest pain. Respiratory: Denies shortness of breath. Gastrointestinal: No abdominal pain.  No nausea, no vomiting.  No diarrhea.  No constipation. Genitourinary: Negative for dysuria. Musculoskeletal: Negative for back pain. Skin: Negative for rash. Neurological: Negative for headaches, focal weakness or numbness.  10-point ROS otherwise negative.  ____________________________________________   PHYSICAL EXAM:  VITAL SIGNS: ED Triage Vitals  Enc Vitals Group     BP 10/04/14 2128 153/92 mmHg     Pulse Rate 10/04/14 2128 117     Resp 10/04/14 2128 22     Temp 10/04/14 2128 98.8 F (37.1 C)     Temp Source 10/04/14 2128 Oral     SpO2 10/04/14 2128 95 %     Weight  10/04/14 2128 236 lb (107.049 kg)     Height 10/04/14 2128 5\' 4"  (1.626 m)     Head Cir --      Peak Flow --      Pain Score 10/04/14 2129 8     Pain Loc --      Pain Edu? --      Excl. in GC? --     Constitutional: Alert and oriented. Well appearing and in no acute distress. Eyes: Conjunctivae are normal. PERRL. EOMI. Head: Atraumatic. Nose: No congestion/rhinnorhea. Mouth/Throat: Mucous membranes are moist.  Oropharynx non-erythematous. Neck: No stridor. Extremities are normal except for the right great toe is tender especially over the IP joint normal capillary refill sensation is intact distally Neurologic:  Normal speech and language. No gross focal neurologic deficits are appreciated. Speech is normal. No gait instability. Skin:  Skin is warm, dry and intact. No rash noted. Psychiatric: Mood and affect are normal. Speech and behavior are normal.  ____________________________________________   LABS (all labs ordered are listed, but only abnormal results are displayed)  Labs Reviewed - No data to display ____________________________________________  EKG   ____________________________________________  RADIOLOGY   ____________________________________________   PROCEDURES  Procedure(s) performed: None  Critical Care performed: No  ____________________________________________   INITIAL IMPRESSION / ASSESSMENT AND PLAN / ED COURSE  Pertinent labs & imaging results that were available during my care of the patient were reviewed  by me and considered in my medical decision making (see chart for details).  X-ray shows a fractured toe ____________________________________________   FINAL CLINICAL IMPRESSION(S) / ED DIAGNOSES  Final diagnoses:  Toe fracture, right, closed, initial encounter     Arnaldo Natal, MD 10/05/14 [phone]

## 2015-01-07 ENCOUNTER — Encounter: Payer: Self-pay | Admitting: Emergency Medicine

## 2015-01-07 ENCOUNTER — Emergency Department
Admission: EM | Admit: 2015-01-07 | Discharge: 2015-01-07 | Disposition: A | Discharge status: Home / Self Care | Payer: Medicaid Other | Attending: Emergency Medicine | Admitting: Emergency Medicine

## 2015-01-07 ENCOUNTER — Emergency Department: Payer: Medicaid Other

## 2015-01-07 DIAGNOSIS — Z720 Tobacco use: Secondary | ICD-10-CM | POA: Insufficient documentation

## 2015-01-07 DIAGNOSIS — Z880 Allergy status to penicillin: Secondary | ICD-10-CM | POA: Diagnosis not present

## 2015-01-07 DIAGNOSIS — I10 Essential (primary) hypertension: Secondary | ICD-10-CM | POA: Diagnosis not present

## 2015-01-07 DIAGNOSIS — Z79899 Other long term (current) drug therapy: Secondary | ICD-10-CM | POA: Insufficient documentation

## 2015-01-07 DIAGNOSIS — M542 Cervicalgia: Secondary | ICD-10-CM | POA: Insufficient documentation

## 2015-01-07 HISTORY — DX: Essential (primary) hypertension: I10

## 2015-01-07 MED ORDER — KETOROLAC TROMETHAMINE 60 MG/2ML IM SOLN
60.0000 mg | Freq: Once | INTRAMUSCULAR | Status: AC
  Administered 2015-01-07: 60 mg via INTRAMUSCULAR
  Filled 2015-01-07: qty 2

## 2015-01-07 MED ORDER — ETODOLAC 400 MG PO TABS: 400.0000 mg | ORAL_TABLET | Freq: Two times a day (BID) | ORAL | Status: DC

## 2015-01-07 MED ORDER — METHOCARBAMOL 500 MG PO TABS: 500.0000 mg | ORAL_TABLET | Freq: Four times a day (QID) | ORAL | Status: DC

## 2015-01-07 NOTE — ED Notes (Signed)
States she was getting a massage on sat and thought maybe it may have been too hard. Now having more pain and spasm to neck.

## 2015-01-07 NOTE — ED Notes (Signed)
Pt presents with low back pain since Saturday.

## 2015-01-07 NOTE — ED Provider Notes (Signed)
[facility] Emergency Department Provider Note ____________________________________________  Time seen: Approximately 10:25 AM  I have reviewed the triage vital signs and the nursing notes.   HISTORY  Chief Complaint Muscle Pain   HPI Paula Keller is a 24 y.o. female is here with complaint of pain. Patient states that she has had a wry neck In the past.  Saturday she had a massage and neck treatment which has left her with an extremely stiff and painful neck. She states she is having spasms which has decreased her range of motion. She's been taking Tylenol without any relief. She denies any paresthesias in her upper extremities. Currently she rates her pain 8 out of 10.   Past Medical History  Diagnosis Date  . Hypertension     There are no active problems to display for this patient.   Past Surgical History  Procedure Laterality Date  . Tonsillectomy      Current Outpatient Rx  Name  Route  Sig  Dispense  Refill  . hydrochlorothiazide (HYDRODIURIL) 25 MG tablet   Oral   Take 25 mg by mouth daily.         Marland Kitchen etodolac (LODINE) 400 MG tablet   Oral   Take 1 tablet (400 mg total) by mouth 2 (two) times daily.   20 tablet   0   . methocarbamol (ROBAXIN) 500 MG tablet   Oral   Take 1 tablet (500 mg total) by mouth 4 (four) times daily.   20 tablet   0   . oxyCODONE-acetaminophen (ROXICET) 5-325 MG per tablet   Oral   Take 1 tablet by mouth every 4 (four) hours as needed for severe pain.   20 tablet   0     Allergies Amoxicillin  Family History  Problem Relation Age of Onset  . Adopted: Yes    Social History Social History  Substance Use Topics  . Smoking status: Current Every Day Smoker -- 0.50 packs/day    Types: Cigarettes  . Smokeless tobacco: None  . Alcohol Use: Yes    Review of Systems Constitutional: No fever/chills Eyes: No visual changes. ENT: No sore throat. Cardiovascular: Denies chest  pain. Respiratory: Denies shortness of breath. Gastrointestinal: No abdominal pain.  No nausea, no vomiting. Musculoskeletal: Negative for back pain. Positive for neck pain Skin: Negative for rash. Neurological: Negative for headaches, focal weakness or numbness.  10-point ROS otherwise negative.  ____________________________________________   PHYSICAL EXAM:  VITAL SIGNS: ED Triage Vitals  Enc Vitals Group     BP 01/07/15 0954 147/95 mmHg     Pulse Rate 01/07/15 0954 75     Resp 01/07/15 0954 18     Temp 01/07/15 0954 98.3 F (36.8 C)     Temp Source 01/07/15 0954 Oral     SpO2 01/07/15 0954 98 %     Weight 01/07/15 0949 230 lb (104.327 kg)     Height 01/07/15 0949  (1.626 m)     Head Cir --      Peak Flow --      Pain Score 01/07/15 0950 8     Pain Loc --      Pain Edu? --      Excl. in GC? --     Constitutional: Alert and oriented. Well appearing and in no acute distress. Eyes: Conjunctivae are normal. PERRL. EOMI. Head: Atraumatic. Nose: No congestion/rhinnorhea. Neck: No stridor.  Moderate tenderness on palpation of cervical spine and paravertebral muscles.  Range of motion is restricted secondary to pain. Hematological/Lymphatic/Immunilogical: No cervical lymphadenopathy. Cardiovascular: Normal rate, regular rhythm. Grossly normal heart sounds.  Good peripheral circulation. Respiratory: Normal respiratory effort.  No retractions. Lungs CTAB. Gastrointestinal: Soft and nontender. No distention.  Musculoskeletal: No gross deformity as noted above on the cervical spine exam. No gross deformity noted of the thoracic and lumbar spine. Moves upper extremities without any difficulty. No lower extremity tenderness nor edema.  No joint effusions. Neurologic:  Normal speech and language. No gross focal neurologic deficits are appreciated. No gait instability. Skin:  Skin is warm, dry and intact. No rash noted. Psychiatric: Mood and affect are normal. Speech and behavior  are normal.  ____________________________________________   LABS (all labs ordered are listed, but only abnormal results are displayed)  Labs Reviewed - No data to display  RADIOLOGY  X-ray of cervical spine shows no fractures per radiologist and reviewed by me. I, Tommi Rumps, personally viewed and evaluated these images (plain radiographs) as part of my medical decision making.   ____________________________________________   PROCEDURES  Procedure(s) performed: None  Critical Care performed: No  ____________________________________________   INITIAL IMPRESSION / ASSESSMENT AND PLAN / ED COURSE  Pertinent labs & imaging results that were available during my care of the patient were reviewed by me and considered in my medical decision making (see chart for details).  She states Toradol injection did help with some of the pain. She was given a prescription for Robaxin and etodolac. She is to follow-up with her doctor in [facility] if any continued problems. ____________________________________________   FINAL CLINICAL IMPRESSION(S) / ED DIAGNOSES  Final diagnoses:  Muscle pain, cervical      Tommi Rumps, PA-C 01/07/15 1321  Sharman Cheek, MD 01/08/15 [phone]

## 2015-01-27 ENCOUNTER — Emergency Department (HOSPITAL_COMMUNITY)
Admission: EM | Admit: 2015-01-27 | Discharge: 2015-01-27 | Disposition: A | Discharge status: Home / Self Care | Payer: Medicaid Other | Attending: Emergency Medicine | Admitting: Emergency Medicine

## 2015-01-27 ENCOUNTER — Encounter (HOSPITAL_COMMUNITY): Payer: Self-pay | Admitting: Emergency Medicine

## 2015-01-27 DIAGNOSIS — Z79899 Other long term (current) drug therapy: Secondary | ICD-10-CM | POA: Insufficient documentation

## 2015-01-27 DIAGNOSIS — R05 Cough: Secondary | ICD-10-CM

## 2015-01-27 DIAGNOSIS — Z720 Tobacco use: Secondary | ICD-10-CM | POA: Diagnosis not present

## 2015-01-27 DIAGNOSIS — H7291 Unspecified perforation of tympanic membrane, right ear: Secondary | ICD-10-CM | POA: Diagnosis not present

## 2015-01-27 DIAGNOSIS — H6691 Otitis media, unspecified, right ear: Secondary | ICD-10-CM

## 2015-01-27 DIAGNOSIS — H6501 Acute serous otitis media, right ear: Secondary | ICD-10-CM | POA: Diagnosis not present

## 2015-01-27 DIAGNOSIS — Z880 Allergy status to penicillin: Secondary | ICD-10-CM | POA: Insufficient documentation

## 2015-01-27 DIAGNOSIS — J069 Acute upper respiratory infection, unspecified: Secondary | ICD-10-CM | POA: Diagnosis not present

## 2015-01-27 DIAGNOSIS — R51 Headache: Secondary | ICD-10-CM | POA: Diagnosis not present

## 2015-01-27 DIAGNOSIS — I10 Essential (primary) hypertension: Secondary | ICD-10-CM | POA: Insufficient documentation

## 2015-01-27 DIAGNOSIS — R059 Cough, unspecified: Secondary | ICD-10-CM

## 2015-01-27 DIAGNOSIS — H9201 Otalgia, right ear: Secondary | ICD-10-CM | POA: Diagnosis present

## 2015-01-27 DIAGNOSIS — J3489 Other specified disorders of nose and nasal sinuses: Secondary | ICD-10-CM

## 2015-01-27 MED ORDER — OFLOXACIN 0.3 % OT SOLN: 10.0000 [drp] | Freq: Every day | OTIC | Status: DC

## 2015-01-27 MED ORDER — CEFDINIR 300 MG PO CAPS: 300.0000 mg | ORAL_CAPSULE | Freq: Two times a day (BID) | ORAL | Status: DC

## 2015-01-27 NOTE — ED Provider Notes (Signed)
CSN: [id number]     Arrival date & time 01/27/15  4098 History  This chart was scribed for non-physician practitioner, Allen Derry, PA-C working with Donnetta Hutching, MD, by Jarvis Morgan, ED Scribe. This patient was seen in room TR05C/TR05C and the patient's care was started at 9:07 AM.     Chief Complaint  Patient presents with  . Otalgia    Patient is a 24 y.o. female presenting with ear pain. The history is provided by the patient. No language interpreter was used.  Otalgia Location:  Right Quality:  Pressure and throbbing Severity:  Moderate (7/10) Onset quality:  Gradual Duration:  2 days Timing:  Constant Progression:  Unchanged Chronicity:  New Context: not direct blow, not elevation change, not foreign body in ear, not loud noise and no water in ear   Relieved by:  Nothing Worsened by:  Nothing tried Ineffective treatments:  OTC medications Associated symptoms: cough, ear discharge, headaches, hearing loss and rhinorrhea   Associated symptoms: no abdominal pain, no diarrhea, no fever, no sore throat and no vomiting   Risk factors: no recent travel, no chronic ear infection and no prior ear surgery     HPI Comments: Paula Keller is a 24 y.o. female who presents to the Emergency Department complaining of constant, throbbing and pressure-like, 7/10, right otalgia onset 2 days, nonradiating, with no known agg/allev factors. She took Tylenol at home with no relief. She notes that she began to develop a whitish discharge from her right ear and used a q-tip and there was blood on the q-tip when she took it out. Pt reports associated cough that is productive of green sputum, clear rhinorrhea, hearing loss in R ear, and headache which is mild. Pt reports sick contacts from her family. She denies any redness behind the ear. She denies any foreign bodies or injury to the ear. She denies any recent swimming or air travel. She denies using q-tips on a regular basis. Pt is  current every day smoker with a 0.5 ppd history. Pt denies any h/o asthma. She denies any fever, chills, chest pain, SOB, sore throat, abdominal pain, nausea, vomiting, diarrhea, constipation, hematuria, dysuria, vaginal bleeding/discharge, numbness, tingling, weakness, itchy or watery eyes, or generalized myalgias.  Past Medical History  Diagnosis Date  . Hypertension    Past Surgical History  Procedure Laterality Date  . Tonsillectomy     Family History  Problem Relation Age of Onset  . Adopted: Yes   Social History  Substance Use Topics  . Smoking status: Current Every Day Smoker -- 0.50 packs/day    Types: Cigarettes  . Smokeless tobacco: None  . Alcohol Use: Yes   OB History    Gravida Para Term Preterm AB TAB SAB Ectopic Multiple Living   0              Review of Systems  Constitutional: Negative for fever and chills.  HENT: Positive for ear discharge, ear pain, hearing loss and rhinorrhea. Negative for sore throat.   Respiratory: Positive for cough. Negative for shortness of breath and wheezing.   Cardiovascular: Negative for chest pain.  Gastrointestinal: Negative for nausea, vomiting, abdominal pain, diarrhea and constipation.  Genitourinary: Negative for dysuria, hematuria and vaginal bleeding.  Musculoskeletal: Negative for myalgias and arthralgias.  Skin: Negative for color change.  Allergic/Immunologic: Negative for immunocompromised state.  Neurological: Positive for headaches. Negative for weakness and numbness.  10 Systems reviewed and all are negative for acute change except as noted  in the HPI.     Allergies  Amoxicillin  Home Medications   Prior to Admission medications   Medication Sig Start Date End Date Taking? Authorizing Provider  etodolac (LODINE) 400 MG tablet Take 1 tablet (400 mg total) by mouth 2 (two) times daily. 01/07/15   Tommi Rumps, PA-C  hydrochlorothiazide (HYDRODIURIL) 25 MG tablet Take 25 mg by mouth daily.    Historical  Provider, MD  methocarbamol (ROBAXIN) 500 MG tablet Take 1 tablet (500 mg total) by mouth 4 (four) times daily. 01/07/15   Tommi Rumps, PA-C  oxyCODONE-acetaminophen (ROXICET) 5-325 MG per tablet Take 1 tablet by mouth every 4 (four) hours as needed for severe pain. 10/05/14   Arnaldo Natal, MD   Triage Vitals: BP 142/93 mmHg  Pulse 88  Temp(Src) 98.6 F (37 C) (Oral)  Resp 18  SpO2 97%  LMP 10/15/2014  Physical Exam  Constitutional: She is oriented to person, place, and time. Vital signs are normal. She appears well-developed and well-nourished.  Non-toxic appearance. No distress.  Afebrile, nontoxic, NAD  HENT:  Head: Normocephalic and atraumatic.  Right Ear: Ear canal normal. There is drainage (bloody). No swelling or tenderness. No foreign bodies. No mastoid tenderness. Tympanic membrane is retracted. A middle ear effusion is present. There is hemotympanum. Decreased hearing is noted.  Left Ear: Hearing, tympanic membrane, external ear and ear canal normal.  Nose: Mucosal edema and rhinorrhea present.  Mouth/Throat: Uvula is midline, oropharynx is clear and moist and mucous membranes are normal. No trismus in the jaw. No uvula swelling.  Right ear with mildly diminished hearing, no pain with pinna retraction, no mastoid tenderness, with visible blood in the external ear canal appears to be draining from hemotympanum, suspected TM rupture, TM retracted with visible effusion noted. Canal with no swelling or purulent drainage, no FB. Left ear is clear. Nose with mild mucosal edema and rhinorrhea. Oropharynx clear and moist, without uvular swelling or deviation, no trismus or drooling, no tonsillar swelling or erythema, no exudates.    Eyes: Conjunctivae and EOM are normal. Pupils are equal, round, and reactive to light. Right eye exhibits no discharge. Left eye exhibits no discharge.  Neck: Normal range of motion. Neck supple.  Cardiovascular: Normal rate, regular rhythm, normal heart  sounds and intact distal pulses.  Exam reveals no gallop and no friction rub.   No murmur heard. Pulmonary/Chest: Effort normal and breath sounds normal. No respiratory distress. She has no decreased breath sounds. She has no wheezes. She has no rhonchi. She has no rales.  CTAB in all lung fields, no w/r/r, no hypoxia or increased WOB, speaking in full sentences, SpO2 97% on RA   Abdominal: Soft. Normal appearance and bowel sounds are normal. She exhibits no distension. There is no tenderness. There is no rigidity, no rebound and no guarding.  Musculoskeletal: Normal range of motion.  Neurological: She is alert and oriented to person, place, and time. She has normal strength. No sensory deficit.  Skin: Skin is warm, dry and intact. No rash noted.  Psychiatric: She has a normal mood and affect. Her behavior is normal.  Nursing note and vitals reviewed.   ED Course  Procedures (including critical care time)  DIAGNOSTIC STUDIES: Oxygen Saturation is 97% on RA, normal by my interpretation.    COORDINATION OF CARE:    Labs Review Labs Reviewed - No data to display  Imaging Review No results found. I have personally reviewed and evaluated these images and lab  results as part of my medical decision-making.   EKG Interpretation None      MDM   Final diagnoses:  Ruptured tympanic membrane, right  URI (upper respiratory infection)  Rhinorrhea  Cough  Acute right otitis media, recurrence not specified, unspecified otitis media type    24 y.o. female here with suspected ruptured TM after cold symptoms for several days. Will treat with abx drops and oral abx, f/up with ENT. Otherwise tx with OTC meds for URI symptoms. I explained the diagnosis and have given explicit precautions to return to the ER including for any other new or worsening symptoms. The patient understands and accepts the medical plan as it's been dictated and I have answered their questions. Discharge instructions  concerning home care and prescriptions have been given. The patient is STABLE and is discharged to home in good condition.   I personally performed the services described in this documentation, which was scribed in my presence. The recorded information has been reviewed and is accurate.  BP 142/93 mmHg  Pulse 88  Temp(Src) 98.6 F (37 C) (Oral)  Resp 18  SpO2 97%  LMP 10/15/2014  Meds ordered this encounter  Medications  . ofloxacin (FLOXIN) 0.3 % otic solution    Sig: Place 10 drops into the right ear daily. X 7 days    Dispense:  5 mL    Refill:  0    Order Specific Question:  Supervising Provider    Answer:  MILLER, BRIAN [[id number]]  . cefdinir (OMNICEF) 300 MG capsule    Sig: Take 1 capsule (300 mg total) by mouth 2 (two) times daily. X 7 days    Dispense:  14 capsule    Refill:  0    Order Specific Question:  Supervising Provider    Answer:  Eber Hong [[id number]]       Sophee Mckimmy Camprubi-Soms, PA-C 01/27/15 1610  Donnetta Hutching, MD 01/27/15 1240

## 2015-01-27 NOTE — Discharge Instructions (Signed)
Continue to stay well-hydrated. Continue to alternate between Tylenol and Ibuprofen for pain or fever. Use Mucinex for cough suppression/expectoration of mucus. Use netipot and flonase to help with nasal congestion. May consider over-the-counter Benadryl or other antihistamine to decrease secretions and for watery itchy eyes. Take antibiotic as directed for your ear infection, and use the antibiotic drops as directed for the ruptured ear drum. Follow up with the ENT doctor in 3-5 days for recheck of your ruptured ear drum. Avoid anything going into your ear. Return to emergency department for emergent changing or worsening of symptoms.   Eardrum Perforation The eardrum is a thin, round tissue inside the ear that separates the ear canal from the middle ear. This is the tissue that detects sound and enables you to hear. The eardrum can be punctured or torn (perforated). Eardrums generally heal without help and with little or no permanent hearing loss. CAUSES   Sudden pressure changes that happen in situations like scuba diving or flying in an airplane.  Foreign objects in the ear.  Inserting a cotton-tipped swab in the ear.  Loud noise.  Trauma to the ear. SYMPTOMS   Hearing loss.  Ear pain.  Ringing in the ears.  Discharge or bleeding from the ear.  Dizziness.  Vomiting.  Facial paralysis. HOME CARE INSTRUCTIONS   Keep your ear dry, as this improves healing. Swimming, diving, and showers are not allowed until healing is complete. While bathing, protect the ear by placing a piece of cotton covered with petroleum jelly in the outer ear canal.  Only take over-the-counter or prescription medicines for pain, discomfort, or fever as directed by your caregiver.  Blow your nose gently. Forceful blowing increases the pressure in the middle ear and may cause further injury or delay healing.  Resume normal activities, such as showering, when the perforation has healed. Your caregiver can  let you know when this has occurred.  Talk to your caregiver before flying on an airplane. Air travel is generally allowed with a perforated eardrum.  If your caregiver has given you a follow-up appointment, it is very important to keep that appointment. Failure to keep the appointment could result in a chronic or permanent injury, pain, hearing loss, and disability. SEEK IMMEDIATE MEDICAL CARE IF:   You have bleeding or pus coming from your ear.  You have problems with balance, dizziness, nausea, or vomiting.  You develop increased pain.  You have a fever. MAKE SURE YOU:   Understand these instructions.  Will watch your condition.  Will get help right away if you are not doing well or get worse. Document Released: 04/29/2000 Document Revised: 07/25/2011 Document Reviewed: 05/01/2008 [facility] Patient Information 2015 [place], [place]. This information is not intended to replace advice given to you by your health care provider. Make sure you discuss any questions you have with your health care provider.  Otitis Media Otitis media is redness, soreness, and inflammation of the middle ear. Otitis media may be caused by allergies or, most commonly, by infection. Often it occurs as a complication of the common cold. SIGNS AND SYMPTOMS Symptoms of otitis media may include:  Earache.  Fever.  Ringing in your ear.  Headache.  Leakage of fluid from the ear. DIAGNOSIS To diagnose otitis media, your health care provider will examine your ear with an otoscope. This is an instrument that allows your health care provider to see into your ear in order to examine your eardrum. Your health care provider also will ask you questions  about your symptoms. TREATMENT  Typically, otitis media resolves on its own within 3-5 days. Your health care provider may prescribe medicine to ease your symptoms of pain. If otitis media does not resolve within 5 days or is recurrent, your health care provider may  prescribe antibiotic medicines if he or she suspects that a bacterial infection is the cause. HOME CARE INSTRUCTIONS   If you were prescribed an antibiotic medicine, finish it all even if you start to feel better.  Take medicines only as directed by your health care provider.  Keep all follow-up visits as directed by your health care provider. SEEK MEDICAL CARE IF:  You have otitis media only in one ear, or bleeding from your nose, or both.  You notice a lump on your neck.  You are not getting better in 3-5 days.  You feel worse instead of better. SEEK IMMEDIATE MEDICAL CARE IF:   You have pain that is not controlled with medicine.  You have swelling, redness, or pain around your ear or stiffness in your neck.  You notice that part of your face is paralyzed.  You notice that the bone behind your ear (mastoid) is tender when you touch it. MAKE SURE YOU:   Understand these instructions.  Will watch your condition.  Will get help right away if you are not doing well or get worse. Document Released: 02/05/2004 Document Revised: 09/16/2013 Document Reviewed: 11/27/2012 [facility] Patient Information 2015 [place], [place]. This information is not intended to replace advice given to you by your health care provider. Make sure you discuss any questions you have with your health care provider.  Upper Respiratory Infection, Adult An upper respiratory infection (URI) is also sometimes known as the common cold. The upper respiratory tract includes the nose, sinuses, throat, trachea, and bronchi. Bronchi are the airways leading to the lungs. Most people improve within 1 week, but symptoms can last up to 2 weeks. A residual cough may last even longer.  CAUSES Many different viruses can infect the tissues lining the upper respiratory tract. The tissues become irritated and inflamed and often become very moist. Mucus production is also common. A cold is contagious. You can easily spread the virus  to others by oral contact. This includes kissing, sharing a glass, coughing, or sneezing. Touching your mouth or nose and then touching a surface, which is then touched by another person, can also spread the virus. SYMPTOMS  Symptoms typically develop 1 to 3 days after you come in contact with a cold virus. Symptoms vary from person to person. They may include:  Runny nose.  Sneezing.  Nasal congestion.  Sinus irritation.  Sore throat.  Loss of voice (laryngitis).  Cough.  Fatigue.  Muscle aches.  Loss of appetite.  Headache.  Low-grade fever. DIAGNOSIS  You might diagnose your own cold based on familiar symptoms, since most people get a cold 2 to 3 times a year. Your caregiver can confirm this based on your exam. Most importantly, your caregiver can check that your symptoms are not due to another disease such as strep throat, sinusitis, pneumonia, asthma, or epiglottitis. Blood tests, throat tests, and X-rays are not necessary to diagnose a common cold, but they may sometimes be helpful in excluding other more serious diseases. Your caregiver will decide if any further tests are required. RISKS AND COMPLICATIONS  You may be at risk for a more severe case of the common cold if you smoke cigarettes, have chronic heart disease (such as heart failure)  or lung disease (such as asthma), or if you have a weakened immune system. The very young and very old are also at risk for more serious infections. Bacterial sinusitis, middle ear infections, and bacterial pneumonia can complicate the common cold. The common cold can worsen asthma and chronic obstructive pulmonary disease (COPD). Sometimes, these complications can require emergency medical care and may be life-threatening. PREVENTION  The best way to protect against getting a cold is to practice good hygiene. Avoid oral or hand contact with people with cold symptoms. Wash your hands often if contact occurs. There is no clear evidence that  vitamin C, vitamin E, echinacea, or exercise reduces the chance of developing a cold. However, it is always recommended to get plenty of rest and practice good nutrition. TREATMENT  Treatment is directed at relieving symptoms. There is no cure. Antibiotics are not effective, because the infection is caused by a virus, not by bacteria. Treatment may include:  Increased fluid intake. Sports drinks offer valuable electrolytes, sugars, and fluids.  Breathing heated mist or steam (vaporizer or shower).  Eating chicken soup or other clear broths, and maintaining good nutrition.  Getting plenty of rest.  Using gargles or lozenges for comfort.  Controlling fevers with ibuprofen or acetaminophen as directed by your caregiver.  Increasing usage of your inhaler if you have asthma. Zinc gel and zinc lozenges, taken in the first 24 hours of the common cold, can shorten the duration and lessen the severity of symptoms. Pain medicines may help with fever, muscle aches, and throat pain. A variety of non-prescription medicines are available to treat congestion and runny nose. Your caregiver can make recommendations and may suggest nasal or lung inhalers for other symptoms.  HOME CARE INSTRUCTIONS   Only take over-the-counter or prescription medicines for pain, discomfort, or fever as directed by your caregiver.  Use a warm mist humidifier or inhale steam from a shower to increase air moisture. This may keep secretions moist and make it easier to breathe.  Drink enough water and fluids to keep your urine clear or pale yellow.  Rest as needed.  Return to work when your temperature has returned to normal or as your caregiver advises. You may need to stay home longer to avoid infecting others. You can also use a face mask and careful hand washing to prevent spread of the virus. SEEK MEDICAL CARE IF:   After the first few days, you feel you are getting worse rather than better.  You need your caregiver's  advice about medicines to control symptoms.  You develop chills, worsening shortness of breath, or brown or red sputum. These may be signs of pneumonia.  You develop yellow or brown nasal discharge or pain in the face, especially when you bend forward. These may be signs of sinusitis.  You develop a fever, swollen neck glands, pain with swallowing, or white areas in the back of your throat. These may be signs of strep throat. SEEK IMMEDIATE MEDICAL CARE IF:   You have a fever.  You develop severe or persistent headache, ear pain, sinus pain, or chest pain.  You develop wheezing, a prolonged cough, cough up blood, or have a change in your usual mucus (if you have chronic lung disease).  You develop sore muscles or a stiff neck. Document Released: 10/26/2000 Document Revised: 07/25/2011 Document Reviewed: 08/07/2013 [facility] Patient Information 2015 [place], [place]. This information is not intended to replace advice given to you by your health care provider. Make sure you discuss  any questions you have with your health care provider. ° °

## 2015-01-27 NOTE — ED Notes (Signed)
Pt has right ear bleeding for 2 days after cold and congestion and ear pain.

## 2015-06-30 ENCOUNTER — Encounter: Payer: Self-pay | Admitting: Emergency Medicine

## 2015-06-30 ENCOUNTER — Emergency Department
Admission: EM | Admit: 2015-06-30 | Discharge: 2015-06-30 | Disposition: A | Discharge status: Home / Self Care | Payer: Medicaid Other | Attending: Emergency Medicine | Admitting: Emergency Medicine

## 2015-06-30 DIAGNOSIS — I10 Essential (primary) hypertension: Secondary | ICD-10-CM | POA: Diagnosis not present

## 2015-06-30 DIAGNOSIS — Z791 Long term (current) use of non-steroidal anti-inflammatories (NSAID): Secondary | ICD-10-CM | POA: Insufficient documentation

## 2015-06-30 DIAGNOSIS — R1110 Vomiting, unspecified: Secondary | ICD-10-CM | POA: Diagnosis not present

## 2015-06-30 DIAGNOSIS — Z79899 Other long term (current) drug therapy: Secondary | ICD-10-CM | POA: Diagnosis not present

## 2015-06-30 DIAGNOSIS — Z880 Allergy status to penicillin: Secondary | ICD-10-CM | POA: Insufficient documentation

## 2015-06-30 DIAGNOSIS — J40 Bronchitis, not specified as acute or chronic: Secondary | ICD-10-CM | POA: Diagnosis not present

## 2015-06-30 DIAGNOSIS — F17210 Nicotine dependence, cigarettes, uncomplicated: Secondary | ICD-10-CM | POA: Diagnosis not present

## 2015-06-30 DIAGNOSIS — R05 Cough: Secondary | ICD-10-CM | POA: Diagnosis present

## 2015-06-30 DIAGNOSIS — Z792 Long term (current) use of antibiotics: Secondary | ICD-10-CM | POA: Insufficient documentation

## 2015-06-30 MED ORDER — CETIRIZINE HCL 10 MG PO TABS: 10.0000 mg | ORAL_TABLET | Freq: Every day | ORAL | Status: DC

## 2015-06-30 MED ORDER — ONDANSETRON 4 MG PO TBDP: 4.0000 mg | ORAL_TABLET | Freq: Three times a day (TID) | ORAL | Status: DC | PRN

## 2015-06-30 MED ORDER — FLUTICASONE PROPIONATE 50 MCG/ACT NA SUSP: 1.0000 | Freq: Two times a day (BID) | NASAL | Status: DC

## 2015-06-30 MED ORDER — PREDNISONE 10 MG PO TABS: 10.0000 mg | ORAL_TABLET | ORAL | Status: DC

## 2015-06-30 MED ORDER — ALBUTEROL SULFATE HFA 108 (90 BASE) MCG/ACT IN AERS: 2.0000 | INHALATION_SPRAY | RESPIRATORY_TRACT | Status: DC | PRN

## 2015-06-30 NOTE — ED Notes (Signed)
States body aches with cough and fever for couple of days   States cough is non prod

## 2015-06-30 NOTE — ED Provider Notes (Signed)
[facility] Emergency Department Provider Note  ____________________________________________  Time seen: Approximately 11:59 AM  I have reviewed the triage vital signs and the nursing notes.   HISTORY  Chief Complaint Cough and Emesis    HPI Paula Keller is a 25 y.o. female who presents emergency department complaining of cough, intermittent fever, and intermittent vomiting. Patient states that the cough is dry in nature and has been consistent over the last 2-3 days. Patient states that she has had an intermittent low-grade tactile fever. She denies any headache, visual acuity changes, sore throat, neck pain, chest pain, abdominal pain, diarrhea or constipation. Patient does endorse some nasal congestion, dry cough, posttussive emesis. Patient is tried cough syrup with no relief.   Past Medical History  Diagnosis Date  . Hypertension     There are no active problems to display for this patient.   Past Surgical History  Procedure Laterality Date  . Tonsillectomy      Current Outpatient Rx  Name  Route  Sig  Dispense  Refill  . albuterol (PROVENTIL HFA;VENTOLIN HFA) 108 (90 Base) MCG/ACT inhaler   Inhalation   Inhale 2 puffs into the lungs every 4 (four) hours as needed for wheezing or shortness of breath.   1 Inhaler   0   . cefdinir (OMNICEF) 300 MG capsule   Oral   Take 1 capsule (300 mg total) by mouth 2 (two) times daily. X 7 days   14 capsule   0   . cetirizine (ZYRTEC) 10 MG tablet   Oral   Take 1 tablet (10 mg total) by mouth daily.   30 tablet   0   . etodolac (LODINE) 400 MG tablet   Oral   Take 1 tablet (400 mg total) by mouth 2 (two) times daily.   20 tablet   0   . fluticasone (FLONASE) 50 MCG/ACT nasal spray   Each Nare   Place 1 spray into both nostrils 2 (two) times daily.   16 g   0   . hydrochlorothiazide (HYDRODIURIL) 25 MG tablet   Oral   Take 25 mg by mouth daily.         . methocarbamol  (ROBAXIN) 500 MG tablet   Oral   Take 1 tablet (500 mg total) by mouth 4 (four) times daily.   20 tablet   0   . ofloxacin (FLOXIN) 0.3 % otic solution   Right Ear   Place 10 drops into the right ear daily. X 7 days   5 mL   0   . ondansetron (ZOFRAN-ODT) 4 MG disintegrating tablet   Oral   Take 1 tablet (4 mg total) by mouth every 8 (eight) hours as needed for nausea or vomiting.   20 tablet   0   . oxyCODONE-acetaminophen (ROXICET) 5-325 MG per tablet   Oral   Take 1 tablet by mouth every 4 (four) hours as needed for severe pain.   20 tablet   0   . predniSONE (DELTASONE) 10 MG tablet   Oral   Take 1 tablet (10 mg total) by mouth as directed.   21 tablet   0     Take on a daily basis of 6, 5, 4, 3, 2, 1     Allergies Amoxicillin  Family History  Problem Relation Age of Onset  . Adopted: Yes    Social History Social History  Substance Use Topics  . Smoking status: Current Every Day Smoker --  0.50 packs/day    Types: Cigarettes  . Smokeless tobacco: None  . Alcohol Use: Yes     Review of Systems  Constitutional: Intermittent tactile fever/chills Eyes: No visual changes. No discharge ENT: No sore throat. Positive for nasal congestion. Negative for ear pain. Cardiovascular: no chest pain. Respiratory: Positive for dry cough. No SOB. Gastrointestinal: No abdominal pain.  No nausea, positive for posttussive vomiting.  No diarrhea.  No constipation. Musculoskeletal: Negative for back pain. Skin: Negative for rash. Neurological: Negative for headaches, focal weakness or numbness. 10-point ROS otherwise negative.  ____________________________________________   PHYSICAL EXAM:  VITAL SIGNS: ED Triage Vitals  Enc Vitals Group     BP 06/30/15 1040 141/93 mmHg     Pulse Rate 06/30/15 1040 95     Resp 06/30/15 1040 18     Temp 06/30/15 1040 98.6 F (37 C)     Temp Source 06/30/15 1040 Oral     SpO2 06/30/15 1040 100 %     Weight 06/30/15 1040 230 lb  (104.327 kg)     Height 06/30/15 1040  (1.626 m)     Head Cir --      Peak Flow --      Pain Score 06/30/15 1041 0     Pain Loc --      Pain Edu? --      Excl. in GC? --      Constitutional: Alert and oriented. Well appearing and in no acute distress. Eyes: Conjunctivae are normal. PERRL. EOMI. Head: Atraumatic. ENT:      Ears: EACs and TMs are unremarkable bilaterally.      Nose: Moderate clear congestion/rhinnorhea. Sinuses are nontender to percussion      Mouth/Throat: Mucous membranes are moist.  Neck: No stridor.   Hematological/Lymphatic/Immunilogical: No cervical lymphadenopathy. Cardiovascular: Normal rate, regular rhythm. Normal S1 and S2.  Good peripheral circulation. Respiratory: Normal respiratory effort without tachypnea or retractions. Lungs diffuse expiratory wheezing in the left lower lung field. Scattered wheezing throughout her some lung fields. No rales or rhonchi. No decreased or absent breath sounds. Gastrointestinal: Bowel sounds 4 quadrants. Soft and nontender. No guarding or rigidity. No distention. No CVA tenderness. Neurologic:  Normal speech and language. No gross focal neurologic deficits are appreciated.  Skin:  Skin is warm, dry and intact. No rash noted. Psychiatric: Mood and affect are normal. Speech and behavior are normal. Patient exhibits appropriate insight and judgement.   ____________________________________________   LABS (all labs ordered are listed, but only abnormal results are displayed)  Labs Reviewed - No data to display ____________________________________________  EKG   ____________________________________________  RADIOLOGY   No results found.  ____________________________________________    PROCEDURES  Procedure(s) performed:       Medications - No data to display   ____________________________________________   INITIAL IMPRESSION / ASSESSMENT AND PLAN / ED COURSE  Pertinent labs & imaging results  that were available during my care of the patient were reviewed by me and considered in my medical decision making (see chart for details).  Patient's diagnosis is consistent with bronchitis with post tussive emesis. Exam is reassuring. No labs or imaging is undertaken at this time.. Patient will be discharged home with prescriptions for Zyrtec, Flonase, prednisone, albuterol, ondansetron.. Patient is to follow up with primary care provider if symptoms persist past this treatment course. Patient is given ED precautions to return to the ED for any worsening or new symptoms.     ____________________________________________  FINAL CLINICAL IMPRESSION(S) / ED  DIAGNOSES  Final diagnoses:  Bronchitis  Post-tussive emesis      NEW MEDICATIONS STARTED DURING THIS VISIT:  New Prescriptions   ALBUTEROL (PROVENTIL HFA;VENTOLIN HFA) 108 (90 BASE) MCG/ACT INHALER    Inhale 2 puffs into the lungs every 4 (four) hours as needed for wheezing or shortness of breath.   CETIRIZINE (ZYRTEC) 10 MG TABLET    Take 1 tablet (10 mg total) by mouth daily.   FLUTICASONE (FLONASE) 50 MCG/ACT NASAL SPRAY    Place 1 spray into both nostrils 2 (two) times daily.   ONDANSETRON (ZOFRAN-ODT) 4 MG DISINTEGRATING TABLET    Take 1 tablet (4 mg total) by mouth every 8 (eight) hours as needed for nausea or vomiting.   PREDNISONE (DELTASONE) 10 MG TABLET    Take 1 tablet (10 mg total) by mouth as directed.       Delorise Royals Cherine Drumgoole, PA-C 06/30/15 1209  Jeanmarie Plant, MD 06/30/15 [phone]

## 2015-06-30 NOTE — Discharge Instructions (Signed)

## 2015-06-30 NOTE — ED Notes (Signed)
Pt states she has been coughing for 2 days now, occasionally vomiting. Pt appears in no distress.

## 2015-07-07 ENCOUNTER — Emergency Department: Payer: Medicaid Other

## 2015-07-07 ENCOUNTER — Emergency Department
Admission: EM | Admit: 2015-07-07 | Discharge: 2015-07-08 | Disposition: A | Discharge status: Home / Self Care | Payer: Medicaid Other | Attending: Emergency Medicine | Admitting: Emergency Medicine

## 2015-07-07 DIAGNOSIS — B9789 Other viral agents as the cause of diseases classified elsewhere: Secondary | ICD-10-CM | POA: Diagnosis not present

## 2015-07-07 DIAGNOSIS — Z79899 Other long term (current) drug therapy: Secondary | ICD-10-CM | POA: Insufficient documentation

## 2015-07-07 DIAGNOSIS — F17210 Nicotine dependence, cigarettes, uncomplicated: Secondary | ICD-10-CM | POA: Diagnosis not present

## 2015-07-07 DIAGNOSIS — J040 Acute laryngitis: Secondary | ICD-10-CM | POA: Diagnosis not present

## 2015-07-07 DIAGNOSIS — J9801 Acute bronchospasm: Secondary | ICD-10-CM | POA: Diagnosis not present

## 2015-07-07 DIAGNOSIS — Z7951 Long term (current) use of inhaled steroids: Secondary | ICD-10-CM | POA: Insufficient documentation

## 2015-07-07 DIAGNOSIS — Z880 Allergy status to penicillin: Secondary | ICD-10-CM | POA: Diagnosis not present

## 2015-07-07 DIAGNOSIS — R05 Cough: Secondary | ICD-10-CM | POA: Diagnosis present

## 2015-07-07 DIAGNOSIS — I10 Essential (primary) hypertension: Secondary | ICD-10-CM | POA: Diagnosis not present

## 2015-07-07 MED ORDER — ALBUTEROL SULFATE (2.5 MG/3ML) 0.083% IN NEBU
2.5000 mg | INHALATION_SOLUTION | Freq: Once | RESPIRATORY_TRACT | Status: AC
  Administered 2015-07-07: 2.5 mg via RESPIRATORY_TRACT
  Filled 2015-07-07: qty 3

## 2015-07-07 MED ORDER — PREDNISONE 20 MG PO TABS: ORAL_TABLET | ORAL | Status: DC

## 2015-07-07 NOTE — Discharge Instructions (Signed)
Laryngitis Laryngitis is swelling (inflammation) of your vocal cords. This causes hoarseness, coughing, loss of voice, sore throat, or a dry throat. When your vocal cords are inflamed, your voice sounds different. Laryngitis can be temporary (acute) or long-term (chronic). Most cases of acute laryngitis improve with time. Chronic laryngitis is laryngitis that lasts for more than three weeks. HOME CARE  Drink enough fluid to keep your pee (urine) clear or pale yellow.  Breathe in moist air. Use a humidifier if you live in a dry climate.  Take medicines only as told by your doctor.  Do not smoke cigarettes or electronic cigarettes. If you need help quitting, ask your doctor.  Talk as little as possible. Also avoid whispering, which can cause vocal strain.  Write instead of talking. Do this until your voice is back to normal. GET HELP IF:  You have a fever.  Your pain is worse.  You have trouble swallowing. GET HELP RIGHT AWAY IF:  You cough up blood.  You have trouble breathing.   This information is not intended to replace advice given to you by your health care provider. Make sure you discuss any questions you have with your health care provider.   Document Released: 04/21/2011 Document Revised: 05/23/2014 Document Reviewed: 10/15/2013 Elsevier Interactive Patient Education 2016 Else[employer] Inc[employer]  Viral Infections A viral infection can be caused by different types of viruses.Most viral infections are not serious and resolve on their own. However, some infections may cause severe symptoms and may lead to further complications. SYMPTOMS Viruses can frequently cause:  Minor sore throat.  Aches and pains.  Headaches.  Runny nose.  Different types of rashes.  Watery eyes.  Tiredness.  Cough.  Loss of appetite.  Gastrointestinal infections, resulting in nausea, vomiting, and diarrhea. These symptoms do not respond to antibiotics because the infection is not caused by  bacteria. However, you might catch a bacterial infection following the viral infection. This is sometimes called a "superinfection." Symptoms of such a bacterial infection may include:  Worsening sore throat with pus and difficulty swallowing.  Swollen neck glands.  Chills and a high or persistent fever.  Severe headache.  Tenderness over the sinuses.  Persistent overall ill feeling (malaise), muscle aches, and tiredness (fatigue).  Persistent cough.  Yellow, green, or brown mucus production with coughing. HOME CARE INSTRUCTIONS   Only take over-the-counter or prescription medicines for pain, discomfort, diarrhea, or fever as directed by your caregiver.  Drink enough water and fluids to keep your urine clear or pale yellow. Sports drinks can provide valuable electrolytes, sugars, and hydration.  Get plenty of rest and maintain proper nutrition. Soups and broths with crackers or rice are fine. SEEK IMMEDIATE MEDICAL CARE IF:   You have severe headaches, shortness of breath, chest pain, neck pain, or an unusual rash.  You have uncontrolled vomiting, diarrhea, or you are unable to keep down fluids.  You or your child has an oral temperature above 102 F (38.9 C), not controlled by medicine.  Your baby is older than 3 months with a rectal temperature of 102 F (38.9 C) or higher.  Your baby is 24 months old or younger with a rectal temperature of 100.4 F (38 C) or higher. MAKE SURE YOU:   Understand these instructions.  Will watch your condition.  Will get help right away if you are not doing well or get worse.   This information is not intended to replace advice given to you by your health care provider. Make  sure you discuss any questions you have with your health care provider.   Document Released: 02/09/2005 Document Revised: 07/25/2011 Document Reviewed: 10/08/2014 Elsevier Interactive Patient Education 2016 Else[employer] Inc.  Bronchospasm, Adult A bronchospasm is  when the tubes that carry air in and out of your lungs (airways) spasm or tighten. During a bronchospasm it is hard to breathe. This is because the airways get smaller. A bronchospasm can be triggered by:  Allergies. These may be to animals, pollen, food, or mold.  Infection. This is a common cause of bronchospasm.  Exercise.  Irritants. These include pollution, cigarette smoke, strong odors, aerosol sprays, and paint fumes.  Weather changes.  Stress.  Being emotional. HOME CARE   Always have a plan for getting help. Know when to call your doctor and local emergency services (911 in the [place] Know where you can get emergency care.  Only take medicines as told by your doctor.  If you were prescribed an inhaler or nebulizer machine, ask your doctor how to use it correctly. Always use a spacer with your inhaler if you were given one.  Stay calm during an attack. Try to relax and breathe more slowly.  Control your home environment:  Change your heating and air conditioning filter at least once a month.  Limit your use of fireplaces and wood stoves.  Do not  smoke. Do not  allow smoking in your home.  Avoid perfumes and fragrances.  Get rid of pests (such as roaches and mice) and their droppings.  Throw away plants if you see mold on them.  Keep your house clean and dust free.  Replace carpet with wood, tile, or vinyl flooring. Carpet can trap dander and dust.  Use allergy-proof pillows, mattress covers, and box spring covers.  Wash bed sheets and blankets every week in hot water. Dry them in a dryer.  Use blankets that are made of polyester or cotton.  Wash hands frequently. GET HELP IF:  You have muscle aches.  You have chest pain.  The thick spit you spit or cough up (sputum) changes from clear or white to yellow, green, gray, or bloody.  The thick spit you spit or cough up gets thicker.  There are problems that may be related to the medicine you are given  such as:  A rash.  Itching.  Swelling.  Trouble breathing. GET HELP RIGHT AWAY IF:  You feel you cannot breathe or catch your breath.  You cannot stop coughing.  Your treatment is not helping you breathe better.  You have very bad chest pain. MAKE SURE YOU:   Understand these instructions.  Will watch your condition.  Will get help right away if you are not doing well or get worse.   This information is not intended to replace advice given to you by your health care provider. Make sure you discuss any questions you have with your health care provider.   Document Released: 02/27/2009 Document Revised: 05/23/2014 Document Reviewed: 10/23/2012 Elsevier Interactive Patient Education [employer]

## 2015-07-07 NOTE — ED Notes (Signed)
Pt states she was dx with bronchitis last week, given steroids and finished that regimen, states coughing "has mostly gone away". Pt is whispering to this RN. Pt states "voice is mostly gone" Pt states CP that goes to back. Pt states sore throat since Saturday. Pt taking tylenol this AM and states no relief, drinking hot tea.

## 2015-07-07 NOTE — ED Provider Notes (Signed)
[facility] Emergency Department Provider Note  ____________________________________________  Time seen: Approximately 10:20 PM  I have reviewed the triage vital signs and the nursing notes.   HISTORY  Chief Complaint Laryngitis and Cough    HPI Aaisha Sliter is a 25 y.o. female , NAD, presents to the emergency department with a scratchy voice, sore throat and cough. Was seen in this emergency department about a week ago and given prescriptions for cetirizine, albuterol inhaler, prednisone and Flonase which has helped her cough. No sore throat is worsened over the last few days. Her, chills, body aches. No known sick contacts. Has been drinking hot tea isn't taking Tylenol with little relief. Denies difficulty breathing or swelling about the throat. Has had some wheezing and congestion.   Past Medical History  Diagnosis Date  . Hypertension     There are no active problems to display for this patient.   Past Surgical History  Procedure Laterality Date  . Tonsillectomy      Current Outpatient Rx  Name  Route  Sig  Dispense  Refill  . albuterol (PROVENTIL HFA;VENTOLIN HFA) 108 (90 Base) MCG/ACT inhaler   Inhalation   Inhale 2 puffs into the lungs every 4 (four) hours as needed for wheezing or shortness of breath.   1 Inhaler   0   . cefdinir (OMNICEF) 300 MG capsule   Oral   Take 1 capsule (300 mg total) by mouth 2 (two) times daily. X 7 days   14 capsule   0   . cetirizine (ZYRTEC) 10 MG tablet   Oral   Take 1 tablet (10 mg total) by mouth daily.   30 tablet   0   . etodolac (LODINE) 400 MG tablet   Oral   Take 1 tablet (400 mg total) by mouth 2 (two) times daily.   20 tablet   0   . fluticasone (FLONASE) 50 MCG/ACT nasal spray   Each Nare   Place 1 spray into both nostrils 2 (two) times daily.   16 g   0   . hydrochlorothiazide (HYDRODIURIL) 25 MG tablet   Oral   Take 25 mg by mouth daily.         . methocarbamol  (ROBAXIN) 500 MG tablet   Oral   Take 1 tablet (500 mg total) by mouth 4 (four) times daily.   20 tablet   0   . ofloxacin (FLOXIN) 0.3 % otic solution   Right Ear   Place 10 drops into the right ear daily. X 7 days   5 mL   0   . ondansetron (ZOFRAN-ODT) 4 MG disintegrating tablet   Oral   Take 1 tablet (4 mg total) by mouth every 8 (eight) hours as needed for nausea or vomiting.   20 tablet   0   . oxyCODONE-acetaminophen (ROXICET) 5-325 MG per tablet   Oral   Take 1 tablet by mouth every 4 (four) hours as needed for severe pain.   20 tablet   0   . predniSONE (DELTASONE) 20 MG tablet      Take 2 tablets by mouth, once daily, for 5 days   10 tablet   0     Allergies Amoxicillin  Family History  Problem Relation Age of Onset  . Adopted: Yes    Social History Social History  Substance Use Topics  . Smoking status: Current Every Day Smoker -- 0.50 packs/day    Types: Cigarettes  .  Smokeless tobacco: Not on file  . Alcohol Use: Yes     Review of Systems  Constitutional: No fever/chills Eyes: No visual changes. No discharge ENT: Positive sore throat, hoarse voice. No ear pain, nasal congestion.  Cardiovascular: No chest pain. Respiratory: Positive cough, wheezing. No shortness of breath. Gastrointestinal: No abdominal pain.  No nausea, vomiting.   Musculoskeletal: Negative for back pain.  Skin: Negative for rash. Neurological: Negative for headaches, focal weakness or numbness. 10-point ROS otherwise negative.  ____________________________________________   PHYSICAL EXAM:  VITAL SIGNS: ED Triage Vitals  Enc Vitals Group     BP 07/07/15 2046 157/87 mmHg     Pulse Rate 07/07/15 2046 80     Resp 07/07/15 2046 20     Temp 07/07/15 2046 98.7 F (37.1 C)     Temp Source 07/07/15 2046 Oral     SpO2 07/07/15 2046 99 %     Weight 07/07/15 2046 230 lb (104.327 kg)     Height 07/07/15 2046  (1.626 m)     Head Cir --      Peak Flow --      Pain  Score 07/07/15 2049 5     Pain Loc --      Pain Edu? --      Excl. in GC? --     Constitutional: Alert and oriented. Well appearing and in no acute distress. Eyes: Conjunctivae are normal. PERRL. EOMI without pain.  Head: Atraumatic. ENT:      Ears: TMs visualized bilaterally within normal limits.       Nose: No congestion/rhinnorhea.      Mouth/Throat: Mucous membranes are moist.  Pharynx with mild injection but no swelling or exudates. Neck: No stridor.  supple with full range of motionogical/Lymphatic/Immunilogical: No cervical lymphadenopathy. Cardiovascular: Normal rate, regular rhythm. Normal S1 and S2.  Good peripheral circulation. Respiratory:  mild wheezing diffusely but breath sounds noted throughout. No rhonchi or rales.Normal respiratory effort without tachypnea or retractions.  Neurologic:  Normal speech and language. No gross focal neurologic deficits are appreciated.  Skin:  Skin is warm, dry and intact. No rash noted. Psychiatric: Mood and affect are normal. Speech and behavior are normal. Patient exhibits appropriate insight and judgement.   ____________________________________________   LABS  None  ____________________________________________  EKG  None ____________________________________________  RADIOLOGY I have personally viewed and evaluated these images (plain radiographs) as part of my medical decision making, as well as reviewing the written report by the radiologist.  Dg Chest 2 View  07/07/2015  CLINICAL DATA:  Acute onset of sore throat, loss of voice and mild cough. Initial encounter. EXAM: CHEST  2 VIEW COMPARISON:  None. FINDINGS: The lungs are well-aerated and clear. There is no evidence of focal opacification, pleural effusion or pneumothorax. The heart is normal in size; the mediastinal contour is within normal limits. No acute osseous abnormalities are seen. IMPRESSION: No acute cardiopulmonary process seen. Electronically Signed   By: Roanna Raider M.D.   On: 07/07/2015 23:36    ____________________________________________    PROCEDURES  Procedure(s) performed: None    Medications  albuterol (PROVENTIL) (2.5 MG/3ML) 0.083% nebulizer solution 2.5 mg (2.5 mg Nebulization Given 07/07/15 2239)    Improvement of wheeze noted after administration of albuterol nebulized solution.  ____________________________________________   INITIAL IMPRESSION / ASSESSMENT AND PLAN / ED COURSE  Pertinent imaging results that were available during my care of the patient were reviewed by me and considered in my medical decision making (see  chart for details).  Patient's diagnosis is consistent withFile laryngitis and acute bronchospasm Patient will be discharged home with prescriptions for Prednisone 20 mg to take 2 tablets by mouth daily for 5 days. May also continue inhaler and cetirizine is given a her last visit in the emergency department. Patient [facility] [place] [facility] [facility] [place] [facility] [place] and to have follow-up of current symptoms.  Patient is given ED precautions to return to the ED for any worsening or new symptoms.    ____________________________________________  FINAL CLINICAL IMPRESSION(S) / ED DIAGNOSES  Final diagnoses:  Viral laryngitis  Acute bronchospasm      NEW MEDICATIONS STARTED DURING THIS VISIT:  New Prescriptions   PREDNISONE (DELTASONE) 20 MG TABLET    Take 2 tablets by mouth, once daily, for 5 days         Hope Pigeon, PA-C 07/07/15 2345  Rockne Menghini, MD 07/08/15 [phone]

## 2015-11-15 IMAGING — CR DG FOOT COMPLETE 3+V*R*
1 series · 3 of 3 positions shown · non-contrast
Comparison: None.

CLINICAL DATA: Slipped on steps, with right great toe pain. Initial
encounter.

EXAM:
RIGHT FOOT COMPLETE - 3+ VIEW

[Series 1: dg foot complete right · 0.14mm/px · 3 of 3 slices shown]
[im 1/3]
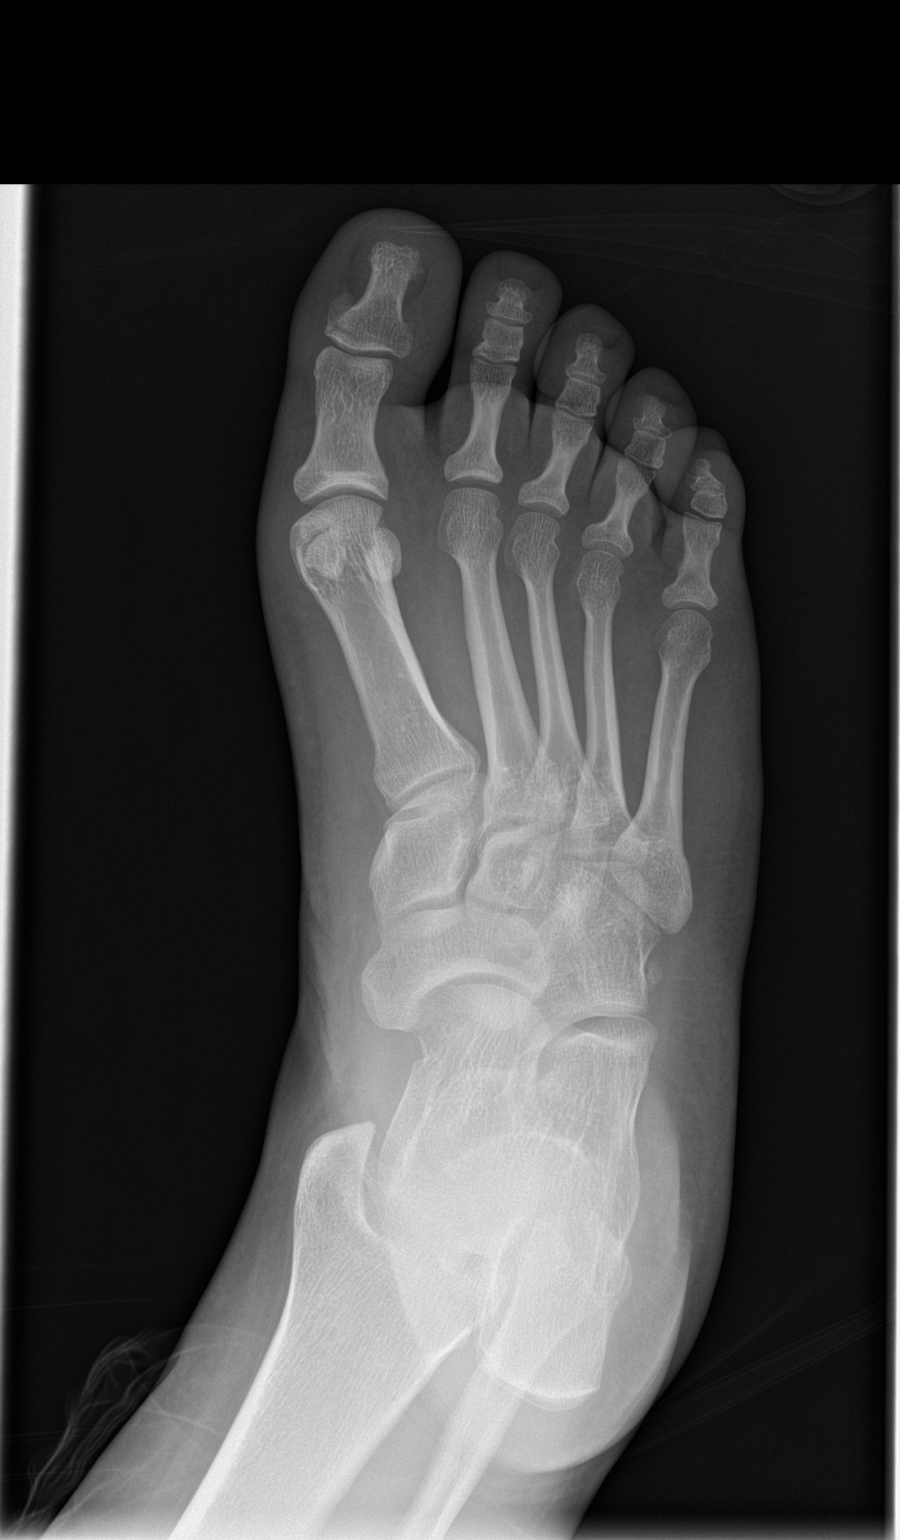
[im 2/3]
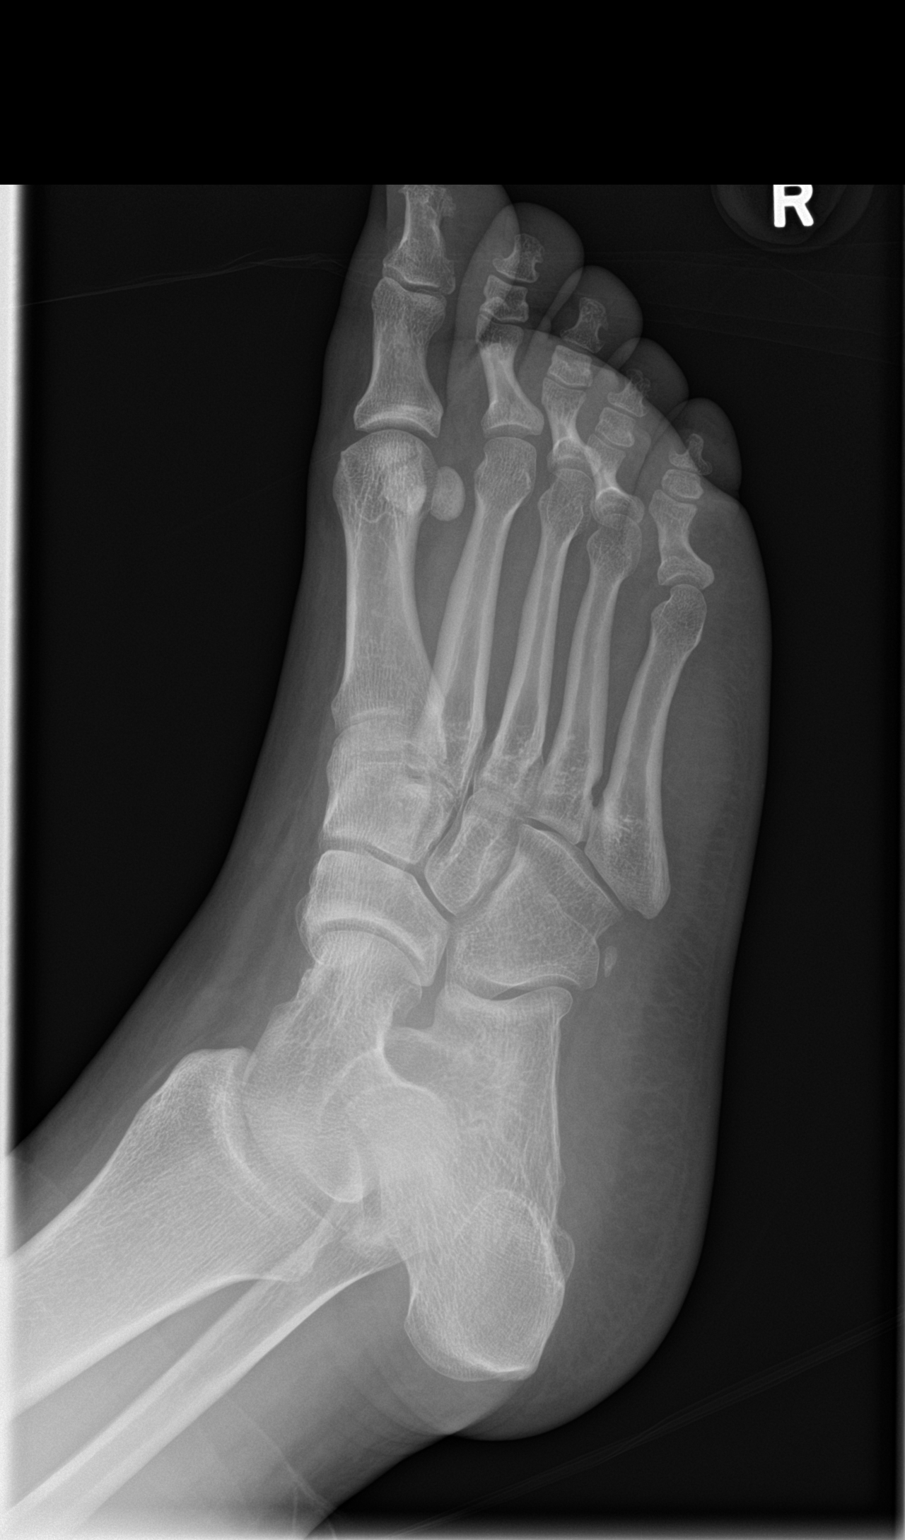
[im 3/3]
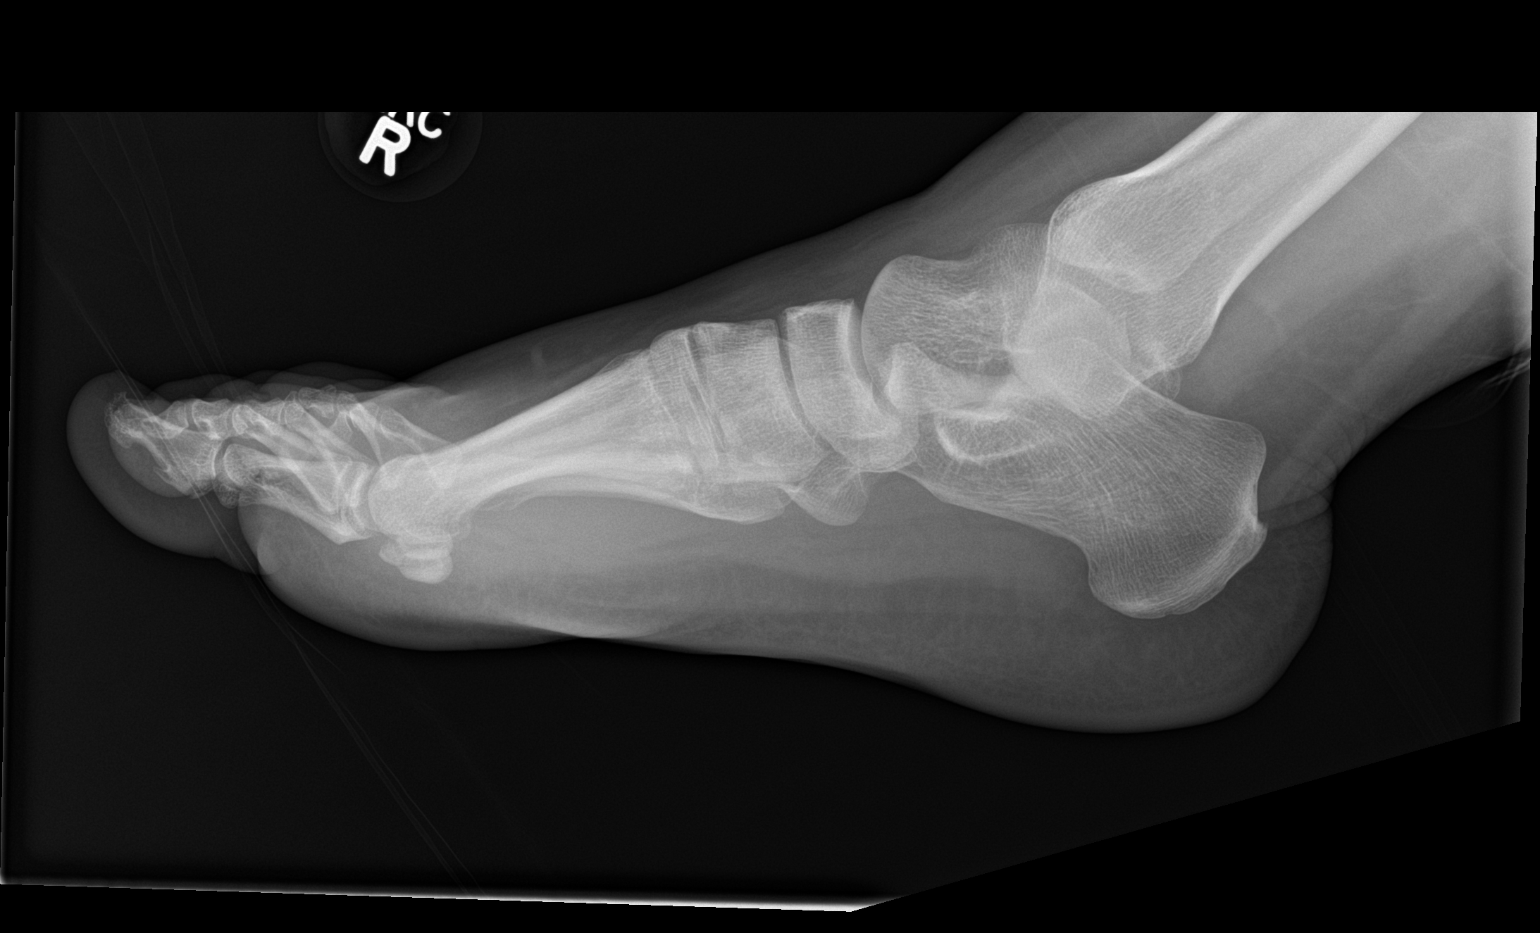

[3 of 3 positions shown; findings below may reference images not displayed]

FINDINGS: There appears to be a mildly comminuted fracture involving the base
of the first distal phalanx, with a small avulsed plantar lateral
fragment. This extends to the joint space. No additional fractures
are seen.

A bipartite medial sesamoid of the first toe is noted. No definite
soft tissue abnormalities are characterized on radiograph.
Visualized joint spaces are otherwise preserved. The subtalar joint
is grossly unremarkable, though difficult to fully assess due to
limitations in positioning.
IMPRESSION: 1. Mildly comminuted fracture involving the base of the first distal
phalanx, with a small avulsed plantar lateral fragment, and
extension to the joint space.
2. Bipartite medial sesamoid of the first toe noted.

## 2016-07-22 ENCOUNTER — Emergency Department
Admission: EM | Admit: 2016-07-22 | Discharge: 2016-07-22 | Disposition: A | Discharge status: Home / Self Care | Payer: Medicaid Other | Attending: Emergency Medicine | Admitting: Emergency Medicine

## 2016-07-22 DIAGNOSIS — F17210 Nicotine dependence, cigarettes, uncomplicated: Secondary | ICD-10-CM | POA: Insufficient documentation

## 2016-07-22 DIAGNOSIS — Z79899 Other long term (current) drug therapy: Secondary | ICD-10-CM | POA: Insufficient documentation

## 2016-07-22 DIAGNOSIS — I10 Essential (primary) hypertension: Secondary | ICD-10-CM

## 2016-07-22 DIAGNOSIS — R197 Diarrhea, unspecified: Secondary | ICD-10-CM | POA: Insufficient documentation

## 2016-07-22 DIAGNOSIS — R112 Nausea with vomiting, unspecified: Secondary | ICD-10-CM | POA: Insufficient documentation

## 2016-07-22 LAB — CBC
HEMATOCRIT: 39.5 % (ref 35.0–47.0)
HEMOGLOBIN: 13.1 g/dL (ref 12.0–16.0)
MCH: 24.8 pg — ABNORMAL LOW (ref 26.0–34.0)
MCHC: 33.1 g/dL (ref 32.0–36.0)
MCV: 75 fL — ABNORMAL LOW (ref 80.0–100.0)
Platelets: 278 10*3/uL (ref 150–440)
RBC: 5.27 MIL/uL — AB (ref 3.80–5.20)
RDW: 13.4 % (ref 11.5–14.5)
WBC: 12.5 10*3/uL — AB (ref 3.6–11.0)

## 2016-07-22 LAB — URINALYSIS, COMPLETE (UACMP) WITH MICROSCOPIC
BACTERIA UA: NONE SEEN
BILIRUBIN URINE: NEGATIVE
GLUCOSE, UA: NEGATIVE mg/dL
HGB URINE DIPSTICK: NEGATIVE
Ketones, ur: NEGATIVE mg/dL
LEUKOCYTES UA: NEGATIVE
NITRITE: NEGATIVE
PROTEIN: NEGATIVE mg/dL
SPECIFIC GRAVITY, URINE: 1.013 (ref 1.005–1.030)
pH: 8 (ref 5.0–8.0)

## 2016-07-22 LAB — COMPREHENSIVE METABOLIC PANEL
ALT: 22 U/L (ref 14–54)
ANION GAP: 7 (ref 5–15)
AST: 21 U/L (ref 15–41)
Albumin: 4.6 g/dL (ref 3.5–5.0)
Alkaline Phosphatase: 91 U/L (ref 38–126)
BUN: 6 mg/dL (ref 6–20)
CALCIUM: 9.4 mg/dL (ref 8.9–10.3)
CO2: 27 mmol/L (ref 22–32)
Chloride: 106 mmol/L (ref 101–111)
Creatinine, Ser: 0.67 mg/dL (ref 0.44–1.00)
Glucose, Bld: 116 mg/dL — ABNORMAL HIGH (ref 65–99)
POTASSIUM: 4 mmol/L (ref 3.5–5.1)
Sodium: 140 mmol/L (ref 135–145)
Total Bilirubin: 0.4 mg/dL (ref 0.3–1.2)
Total Protein: 7.9 g/dL (ref 6.5–8.1)

## 2016-07-22 LAB — POCT PREGNANCY, URINE: PREG TEST UR: NEGATIVE

## 2016-07-22 LAB — LIPASE, BLOOD: Lipase: <10 U/L — ABNORMAL LOW (ref 11–51)

## 2016-07-22 MED ORDER — ONDANSETRON HCL 4 MG PO TABS: 4.0000 mg | ORAL_TABLET | Freq: Every day | ORAL | 0 refills | Status: DC | PRN

## 2016-07-22 MED ORDER — SODIUM CHLORIDE 0.9 % IV BOLUS (SEPSIS)
1000.0000 mL | Freq: Once | INTRAVENOUS | Status: AC
  Administered 2016-07-22: 1000 mL via INTRAVENOUS

## 2016-07-22 MED ORDER — ONDANSETRON HCL 4 MG/2ML IJ SOLN
4.0000 mg | Freq: Once | INTRAMUSCULAR | Status: AC
  Administered 2016-07-22: 4 mg via INTRAVENOUS
  Filled 2016-07-22: qty 2

## 2016-07-22 MED ORDER — HYDROCHLOROTHIAZIDE 12.5 MG PO CAPS: 12.5000 mg | ORAL_CAPSULE | Freq: Every day | ORAL | 0 refills | Status: DC

## 2016-07-22 MED ORDER — KETOROLAC TROMETHAMINE 30 MG/ML IJ SOLN
30.0000 mg | Freq: Once | INTRAMUSCULAR | Status: AC
  Administered 2016-07-22: 30 mg via INTRAVENOUS
  Filled 2016-07-22: qty 1

## 2016-07-22 NOTE — ED Triage Notes (Signed)
Pt reports headache that began this morning at the back of her head. Pt reports NVD for two days. Pt reports has been out of birth control so she does not know if she is pregnant.

## 2016-07-22 NOTE — ED Provider Notes (Signed)
[facility] Emergency Department Provider Note  ____________________________________________   First MD Initiated Contact with Patient 07/22/16 1209     (approximate)  I have reviewed the triage vital signs and the nursing notes.   HISTORY  Chief Complaint Emesis; Diarrhea; and Headache   HPI Paula Keller is a 26 y.o. female with a history of hypertension was presenting to the emergency department today which is been off and on for 3 days as well as a posterior headache. She says that she has vomited multiple times today especially between the hours of 3 and 8 AM. Says that she has had one episode of diarrhea. Denies any blood in the vomitus or stool. Denies any abdominal pain at this time does say that she has had intermittent abdominal cramping. Says the headache is a 7 out of 10 and is posterior. Light does not bother her. Does not report any neck pain. Says the headache was gradual in onset and feels like a throbbing type pain. No known sick contacts.   Past Medical History:  Diagnosis Date  . Hypertension     There are no active problems to display for this patient.   Past Surgical History:  Procedure Laterality Date  . TONSILLECTOMY      Prior to Admission medications   Medication Sig Start Date End Date Taking? Authorizing Provider  albuterol (PROVENTIL HFA;VENTOLIN HFA) 108 (90 Base) MCG/ACT inhaler Inhale 2 puffs into the lungs every 4 (four) hours as needed for wheezing or shortness of breath. 06/30/15  Yes Christiane Ha D Cuthriell, PA-C  cefdinir (OMNICEF) 300 MG capsule Take 1 capsule (300 mg total) by mouth 2 (two) times daily. X 7 days Patient not taking: Reported on 07/22/2016 01/27/15   Mercedes Street, PA-C  cetirizine (ZYRTEC) 10 MG tablet Take 1 tablet (10 mg total) by mouth daily. Patient not taking: Reported on 07/22/2016 06/30/15   Delorise Royals Cuthriell, PA-C  etodolac (LODINE) 400 MG tablet Take 1 tablet (400 mg total) by mouth  2 (two) times daily. Patient not taking: Reported on 07/22/2016 01/07/15   Tommi Rumps, PA-C  fluticasone [facility]) 50 MCG/ACT nasal spray Place 1 spray into both nostrils 2 (two) times daily. Patient not taking: Reported on 07/22/2016 06/30/15   Delorise Royals Cuthriell, PA-C  hydrochlorothiazide (HYDRODIURIL) 25 MG tablet Take 25 mg by mouth daily.    Historical Provider, MD  methocarbamol (ROBAXIN) 500 MG tablet Take 1 tablet (500 mg total) by mouth 4 (four) times daily. Patient not taking: Reported on 07/22/2016 01/07/15   Tommi Rumps, PA-C  ofloxacin (FLOXIN) 0.3 % otic solution Place 10 drops into the right ear daily. X 7 days Patient not taking: Reported on 07/22/2016 01/27/15   Mercedes Street, PA-C  ondansetron (ZOFRAN-ODT) 4 MG disintegrating tablet Take 1 tablet (4 mg total) by mouth every 8 (eight) hours as needed for nausea or vomiting. Patient not taking: Reported on 07/22/2016 06/30/15   Delorise Royals Cuthriell, PA-C  oxyCODONE-acetaminophen (ROXICET) 5-325 MG per tablet Take 1 tablet by mouth every 4 (four) hours as needed for severe pain. Patient not taking: Reported on 07/22/2016 10/05/14   Arnaldo Natal, MD  predniSONE (DELTASONE) 20 MG tablet Take 2 tablets by mouth, once daily, for 5 days Patient not taking: Reported on 07/22/2016 07/07/15   Jami L Hagler, PA-C    Allergies Amoxicillin  Family History  Problem Relation Age of Onset  . Adopted: Yes    Social History Social History  Substance Use Topics  . Smoking status: Current Every Day Smoker    Packs/day: 0.50    Types: Cigarettes  . Smokeless tobacco: Not on file  . Alcohol use Yes    Review of Systems Constitutional: No fever/chills Eyes: No visual changes. ENT: No sore throat. Cardiovascular: Denies chest pain. Respiratory: Denies shortness of breath. Gastrointestinal:  No constipation. Genitourinary: Negative for dysuria. Musculoskeletal: Negative for back pain. Skin: Negative for rash. Neurological: Negative  for headaches, focal weakness or numbness.  10-point ROS otherwise negative.  ____________________________________________   PHYSICAL EXAM:  VITAL SIGNS: ED Triage Vitals  Enc Vitals Group     BP 07/22/16 1008 (!) 169/89     Pulse Rate 07/22/16 1008 86     Resp 07/22/16 1008 16     Temp 07/22/16 1008 98.9 F (37.2 C)     Temp Source 07/22/16 1008 Oral     SpO2 07/22/16 1008 98 %     Weight 07/22/16 1010 254 lb (115.2 kg)     Height 07/22/16 1010 5\' 4"  (1.626 m)     Head Circumference --      Peak Flow --      Pain Score 07/22/16 1013 6     Pain Loc --      Pain Edu? --      Excl. in GC? --     Constitutional: Alert and oriented. Well appearing and in no acute distress. Eyes: Conjunctivae are normal. PERRL. EOMI. Head: Atraumatic. Nose: No congestion/rhinnorhea. Mouth/Throat: Mucous membranes are moist.   Neck: No stridor.   Cardiovascular: Normal rate, regular rhythm. Grossly normal heart sounds.   Respiratory: Normal respiratory effort.  No retractions. Lungs CTAB. Gastrointestinal: Soft and nontender. No distention. Musculoskeletal: No lower extremity tenderness nor edema.  No joint effusions. Neurologic:  Normal speech and language. No gross focal neurologic deficits are appreciated. Skin:  Skin is warm, dry and intact. No rash noted. Psychiatric: Mood and affect are normal. Speech and behavior are normal.  ____________________________________________   LABS (all labs ordered are listed, but only abnormal results are displayed)  Labs Reviewed  LIPASE, BLOOD - Abnormal; Notable for the following:       Result Value   Lipase <10 (*)    All other components within normal limits  COMPREHENSIVE METABOLIC PANEL - Abnormal; Notable for the following:    Glucose, Bld 116 (*)    All other components within normal limits  CBC - Abnormal; Notable for the following:    WBC 12.5 (*)    RBC 5.27 (*)    MCV 75.0 (*)    MCH 24.8 (*)    All other components within  normal limits  URINALYSIS, COMPLETE (UACMP) WITH MICROSCOPIC - Abnormal; Notable for the following:    Color, Urine YELLOW (*)    APPearance CLEAR (*)    Squamous Epithelial / LPF 6-30 (*)    All other components within normal limits  POC URINE PREG, ED  POCT PREGNANCY, URINE   ____________________________________________  EKG   ____________________________________________  RADIOLOGY   ____________________________________________   PROCEDURES  Procedure(s) performed:   Procedures  Critical Care performed:   ____________________________________________   INITIAL IMPRESSION / ASSESSMENT AND PLAN / ED COURSE  Pertinent labs & imaging results that were available during my care of the patient were reviewed by me and considered in my medical decision making (see chart for details).  Patient with likely viral illness causing her symptoms. Also says that she is supposed be on blood  pressure medication, but does not have primary care doctor to follow with.    ----------------------------------------- 2:46 PM on 07/22/2016 -----------------------------------------  Patient says that her headache is now greatly improved. Tolerating by mouth fluids. Will be discharged home. Likely viral illness. We'll also start on HCTZ for hypertension and give her follow-up with the [facility]. She is understanding the plan to follow-up in one to comply.  ____________________________________________   FINAL CLINICAL IMPRESSION(S) / ED DIAGNOSES  Nausea vomiting and diarrhea. Hypertension.    NEW MEDICATIONS STARTED DURING THIS VISIT:  New Prescriptions   No medications on file     Note:  This document was prepared using Dragon voice recognition software and may include unintentional dictation errors.    Myrna Blazer, MD 07/22/16 [phone]

## 2016-08-30 ENCOUNTER — Encounter: Payer: Self-pay | Admitting: Emergency Medicine

## 2016-08-30 ENCOUNTER — Emergency Department
Admission: EM | Admit: 2016-08-30 | Discharge: 2016-08-30 | Disposition: A | Discharge status: Home / Self Care | Payer: Medicaid Other | Attending: Student in an Organized Health Care Education/Training Program | Admitting: Student in an Organized Health Care Education/Training Program

## 2016-08-30 DIAGNOSIS — F17210 Nicotine dependence, cigarettes, uncomplicated: Secondary | ICD-10-CM | POA: Insufficient documentation

## 2016-08-30 DIAGNOSIS — R110 Nausea: Secondary | ICD-10-CM

## 2016-08-30 DIAGNOSIS — N9489 Other specified conditions associated with female genital organs and menstrual cycle: Secondary | ICD-10-CM | POA: Insufficient documentation

## 2016-08-30 DIAGNOSIS — N912 Amenorrhea, unspecified: Secondary | ICD-10-CM | POA: Insufficient documentation

## 2016-08-30 DIAGNOSIS — R112 Nausea with vomiting, unspecified: Secondary | ICD-10-CM | POA: Insufficient documentation

## 2016-08-30 DIAGNOSIS — Z79899 Other long term (current) drug therapy: Secondary | ICD-10-CM | POA: Insufficient documentation

## 2016-08-30 DIAGNOSIS — I10 Essential (primary) hypertension: Secondary | ICD-10-CM | POA: Insufficient documentation

## 2016-08-30 LAB — HCG, QUANTITATIVE, PREGNANCY

## 2016-08-30 MED ORDER — PROMETHAZINE HCL 25 MG PO TABS: 25.0000 mg | ORAL_TABLET | Freq: Four times a day (QID) | ORAL | 0 refills | Status: DC | PRN

## 2016-08-30 NOTE — Discharge Instructions (Signed)
° °  Pregnancy test negative by blood test.

## 2016-08-30 NOTE — ED Triage Notes (Signed)
Pt c/o nausea with positive and neg pregnancy test.

## 2016-08-30 NOTE — ED Provider Notes (Signed)
[facility] Emergency Department Provider Note   ____________________________________________   First MD Initiated Contact with Patient 08/30/16 [phone]     (approximate)  I have reviewed the triage vital signs and the nursing notes.   HISTORY  Chief Complaint Nausea    HPI Paula Keller is a 26 y.o. female patient third visit for nausea vomiting. She states she's has positive negative urine pregnancy test prior to arrival. Patient pregnancy test in this facility has been negative by urine. Patient requests definitive fracture test beta-hCG. Patient state amenorrhea greater than 1 month. Patient states her hypertension has improved since starting HCTZ which was prescribed last visit. Past Medical History:  Diagnosis Date  . Hypertension     There are no active problems to display for this patient.   Past Surgical History:  Procedure Laterality Date  . TONSILLECTOMY      Prior to Admission medications   Medication Sig Start Date End Date Taking? Authorizing Provider  albuterol (PROVENTIL HFA;VENTOLIN HFA) 108 (90 Base) MCG/ACT inhaler Inhale 2 puffs into the lungs every 4 (four) hours as needed for wheezing or shortness of breath. 06/30/15   Delorise Royals Cuthriell, PA-C  cefdinir (OMNICEF) 300 MG capsule Take 1 capsule (300 mg total) by mouth 2 (two) times daily. X 7 days Patient not taking: Reported on 07/22/2016 01/27/15   Mercedes Street, PA-C  cetirizine (ZYRTEC) 10 MG tablet Take 1 tablet (10 mg total) by mouth daily. Patient not taking: Reported on 07/22/2016 06/30/15   Delorise Royals Cuthriell, PA-C  etodolac (LODINE) 400 MG tablet Take 1 tablet (400 mg total) by mouth 2 (two) times daily. Patient not taking: Reported on 07/22/2016 01/07/15   Tommi Rumps, PA-C  fluticasone [facility]) 50 MCG/ACT nasal spray Place 1 spray into both nostrils 2 (two) times daily. Patient not taking: Reported on 07/22/2016 06/30/15   Delorise Royals Cuthriell, PA-C    hydrochlorothiazide (MICROZIDE) 12.5 MG capsule Take 1 capsule (12.5 mg total) by mouth daily. 07/22/16   Myrna Blazer, MD  methocarbamol (ROBAXIN) 500 MG tablet Take 1 tablet (500 mg total) by mouth 4 (four) times daily. Patient not taking: Reported on 07/22/2016 01/07/15   Tommi Rumps, PA-C  ofloxacin (FLOXIN) 0.3 % otic solution Place 10 drops into the right ear daily. X 7 days Patient not taking: Reported on 07/22/2016 01/27/15   Mercedes Street, PA-C  ondansetron (ZOFRAN) 4 MG tablet Take 1 tablet (4 mg total) by mouth daily as needed. 07/22/16   Myrna Blazer, MD  ondansetron (ZOFRAN-ODT) 4 MG disintegrating tablet Take 1 tablet (4 mg total) by mouth every 8 (eight) hours as needed for nausea or vomiting. Patient not taking: Reported on 07/22/2016 06/30/15   Delorise Royals Cuthriell, PA-C  oxyCODONE-acetaminophen (ROXICET) 5-325 MG per tablet Take 1 tablet by mouth every 4 (four) hours as needed for severe pain. Patient not taking: Reported on 07/22/2016 10/05/14   Arnaldo Natal, MD  predniSONE (DELTASONE) 20 MG tablet Take 2 tablets by mouth, once daily, for 5 days Patient not taking: Reported on 07/22/2016 07/07/15   Jami L Hagler, PA-C  promethazine (PHENERGAN) 25 MG tablet Take 1 tablet (25 mg total) by mouth every 6 (six) hours as needed for nausea or vomiting. 08/30/16   Joni Reining, PA-C    Allergies Amoxicillin  Family History  Problem Relation Age of Onset  . Adopted: Yes    Social History Social History  Substance Use Topics  . Smoking  status: Current Every Day Smoker    Packs/day: 0.50    Types: Cigarettes  . Smokeless tobacco: Not on file  . Alcohol use Yes    Review of Systems Constitutional: No fever/chills Eyes: No visual changes. ENT: No sore throat. Cardiovascular: Denies chest pain. Respiratory: Denies shortness of breath. Gastrointestinal: No abdominal pain.  Nausea and vomiting.  No diarrhea.  No constipation. Genitourinary: Negative for  dysuria. Amenorrhea Musculoskeletal: Negative for back pain. Skin: Negative for rash. Neurological: Negative for headaches, focal weakness or numbness.    ____________________________________________   PHYSICAL EXAM:  VITAL SIGNS: ED Triage Vitals  Enc Vitals Group     BP 08/30/16 0839 (!) 154/95     Pulse Rate 08/30/16 0838 88     Resp 08/30/16 0838 16     Temp 08/30/16 0838 98.4 F (36.9 C)     Temp Source 08/30/16 0838 Oral     SpO2 08/30/16 0838 98 %     Weight 08/30/16 0838 245 lb (111.1 kg)     Height 08/30/16 0838  (1.626 m)     Head Circumference --      Peak Flow --      Pain Score --      Pain Loc --      Pain Edu? --      Excl. in GC? --     Constitutional: Alert and oriented. Well appearing and in no acute distress. Eyes: Conjunctivae are normal. PERRL. EOMI. Head: Atraumatic. Nose: No congestion/rhinnorhea. Mouth/Throat: Mucous membranes are moist.  Oropharynx non-erythematous. Neck: No stridor.  No cervical spine tenderness to palpation. Hematological/Lymphatic/Immunilogical: No cervical lymphadenopathy. Cardiovascular: Normal rate, regular rhythm. Grossly normal heart sounds.  Good peripheral circulation. Respiratory: Normal respiratory effort.  No retractions. Lungs CTAB. Gastrointestinal: Soft and nontender. No distention. No abdominal bruits. No CVA tenderness. Musculoskeletal: No lower extremity tenderness nor edema.  No joint effusions. Neurologic:  Normal speech and language. No gross focal neurologic deficits are appreciated. No gait instability. Skin:  Skin is warm, dry and intact. No rash noted. Psychiatric: Mood and affect are normal. Speech and behavior are normal.  ____________________________________________   LABS (all labs ordered are listed, but only abnormal results are displayed)  Labs Reviewed  HCG, QUANTITATIVE, PREGNANCY    ____________________________________________  EKG   ____________________________________________  RADIOLOGY   ____________________________________________   PROCEDURES  Procedure(s) performed: None  Procedures  Critical Care performed: No  ____________________________________________   INITIAL IMPRESSION / ASSESSMENT AND PLAN / ED COURSE  Pertinent labs & imaging results that were available during my care of the patient were reviewed by me and considered in my medical decision making (see chart for details).  Nausea and vomiting and amenorrhea.      ____________________________________________   FINAL CLINICAL IMPRESSION(S) / ED DIAGNOSES  Final diagnoses:  Nausea  Discussed negative beta hCG results with patient. [facility]    NEW MEDICATIONS STARTED DURING THIS VISIT:  New Prescriptions   PROMETHAZINE (PHENERGAN) 25 MG TABLET    Take 1 tablet (25 mg total) by mouth every 6 (six) hours as needed for nausea or vomiting.     Note:  This document was prepared using Dragon voice recognition software and may include unintentional dictation errors.    Joni Reining, PA-C 08/30/16 1030    Willy Eddy, MD 08/30/16 [phone]

## 2016-08-30 NOTE — ED Provider Notes (Signed)
[facility] Emergency Department Provider Note   ____________________________________________   First MD Initiated Contact with Patient 08/30/16 [phone]     (approximate)  I have reviewed the triage vital signs and the nursing notes.   HISTORY  Chief Complaint Nausea    HPI Paula Keller is a 26 y.o. female patient third visit for nausea /vomiting, amenorrhea, and states positive negative urine test pregnancy. Patient is not followed up as directed with [facility]. Patient hypertension has improved since starting HCTZ.  Past Medical History:  Diagnosis Date  . Hypertension     There are no active problems to display for this patient.   Past Surgical History:  Procedure Laterality Date  . TONSILLECTOMY      Prior to Admission medications   Medication Sig Start Date End Date Taking? Authorizing Provider  albuterol (PROVENTIL HFA;VENTOLIN HFA) 108 (90 Base) MCG/ACT inhaler Inhale 2 puffs into the lungs every 4 (four) hours as needed for wheezing or shortness of breath. 06/30/15   Delorise Royals Cuthriell, PA-C  cefdinir (OMNICEF) 300 MG capsule Take 1 capsule (300 mg total) by mouth 2 (two) times daily. X 7 days Patient not taking: Reported on 07/22/2016 01/27/15   Mercedes Street, PA-C  cetirizine (ZYRTEC) 10 MG tablet Take 1 tablet (10 mg total) by mouth daily. Patient not taking: Reported on 07/22/2016 06/30/15   Delorise Royals Cuthriell, PA-C  etodolac (LODINE) 400 MG tablet Take 1 tablet (400 mg total) by mouth 2 (two) times daily. Patient not taking: Reported on 07/22/2016 01/07/15   Tommi Rumps, PA-C  fluticasone [facility]) 50 MCG/ACT nasal spray Place 1 spray into both nostrils 2 (two) times daily. Patient not taking: Reported on 07/22/2016 06/30/15   Delorise Royals Cuthriell, PA-C  hydrochlorothiazide (MICROZIDE) 12.5 MG capsule Take 1 capsule (12.5 mg total) by mouth daily. 07/22/16   Myrna Blazer, MD  methocarbamol  (ROBAXIN) 500 MG tablet Take 1 tablet (500 mg total) by mouth 4 (four) times daily. Patient not taking: Reported on 07/22/2016 01/07/15   Tommi Rumps, PA-C  ofloxacin (FLOXIN) 0.3 % otic solution Place 10 drops into the right ear daily. X 7 days Patient not taking: Reported on 07/22/2016 01/27/15   Mercedes Street, PA-C  ondansetron (ZOFRAN) 4 MG tablet Take 1 tablet (4 mg total) by mouth daily as needed. 07/22/16   Myrna Blazer, MD  ondansetron (ZOFRAN-ODT) 4 MG disintegrating tablet Take 1 tablet (4 mg total) by mouth every 8 (eight) hours as needed for nausea or vomiting. Patient not taking: Reported on 07/22/2016 06/30/15   Delorise Royals Cuthriell, PA-C  oxyCODONE-acetaminophen (ROXICET) 5-325 MG per tablet Take 1 tablet by mouth every 4 (four) hours as needed for severe pain. Patient not taking: Reported on 07/22/2016 10/05/14   Arnaldo Natal, MD  predniSONE (DELTASONE) 20 MG tablet Take 2 tablets by mouth, once daily, for 5 days Patient not taking: Reported on 07/22/2016 07/07/15   Jami L Hagler, PA-C  promethazine (PHENERGAN) 25 MG tablet Take 1 tablet (25 mg total) by mouth every 6 (six) hours as needed for nausea or vomiting. 08/30/16   Joni Reining, PA-C    Allergies Amoxicillin  Family History  Problem Relation Age of Onset  . Adopted: Yes    Social History Social History  Substance Use Topics  . Smoking status: Current Every Day Smoker    Packs/day: 0.50    Types: Cigarettes  . Smokeless tobacco: Not  on file  . Alcohol use Yes    Review of Systems Constitutional: No fever/chills Eyes: No visual changes. ENT: No sore throat. Cardiovascular: Denies chest pain. Respiratory: Denies shortness of breath. Gastrointestinal: No abdominal pain.  Nausea.  No diarrhea.  No constipation. Genitourinary: Negative for dysuria. Musculoskeletal: Negative for back pain. Skin: Negative for rash. Neurological: Negative for headaches, focal weakness or  numbness. Endocrine:Hypertension ____________________________________________   PHYSICAL EXAM:  VITAL SIGNS: ED Triage Vitals  Enc Vitals Group     BP 08/30/16 0839 (!) 154/95     Pulse Rate 08/30/16 0838 88     Resp 08/30/16 0838 16     Temp 08/30/16 0838 98.4 F (36.9 C)     Temp Source 08/30/16 0838 Oral     SpO2 08/30/16 0838 98 %     Weight 08/30/16 0838 245 lb (111.1 kg)     Height 08/30/16 0838  (1.626 m)     Head Circumference --      Peak Flow --      Pain Score --      Pain Loc --      Pain Edu? --      Excl. in GC? --     Constitutional: Alert and oriented. Well appearing and in no acute distress. Eyes: Conjunctivae are normal. PERRL. EOMI. Head: Atraumatic. Nose: No congestion/rhinnorhea. Mouth/Throat: Mucous membranes are moist.  Oropharynx non-erythematous. Neck: No stridor. No cervical spine tenderness to palpation. Hematological/Lymphatic/Immunilogical: No cervical lymphadenopathy. Cardiovascular: Normal rate, regular rhythm. Grossly normal heart sounds.  Good peripheral circulation.Elevated blood pressure Respiratory: Normal respiratory effort.  No retractions. Lungs CTAB. Gastrointestinal: Soft and nontender. No distention. No abdominal bruits. No CVA tenderness. Musculoskeletal: No lower extremity tenderness nor edema.  No joint effusions. Neurologic:  Normal speech and language. No gross focal neurologic deficits are appreciated. No gait instability. Skin:  Skin is warm, dry and intact. No rash noted. Psychiatric: Mood and affect are normal. Speech and behavior are normal.  ____________________________________________   LABS (all labs ordered are listed, but only abnormal results are displayed)  Labs Reviewed  HCG, QUANTITATIVE, PREGNANCY   ____________________________________________  EKG   ____________________________________________  RADIOLOGY   ____________________________________________   PROCEDURES  Procedure(s)  performed: None  Procedures  Critical Care performed: No  ____________________________________________   INITIAL IMPRESSION / ASSESSMENT AND PLAN / ED COURSE  Pertinent labs & imaging results that were available during my care of the patient were reviewed by me and considered in my medical decision making (see chart for details).  Nausea with negative pregnancy test by beta hCG.      ____________________________________________   FINAL CLINICAL IMPRESSION(S) / ED DIAGNOSES  Final diagnoses:  Nausea      NEW MEDICATIONS STARTED DURING THIS VISIT:  Discharge Medication List as of 08/30/2016 10:24 AM    START taking these medications   Details  promethazine (PHENERGAN) 25 MG tablet Take 1 tablet (25 mg total) by mouth every 6 (six) hours as needed for nausea or vomiting., Starting Tue 08/30/2016, Print         Note:  This document was prepared using Dragon voice recognition software and may include unintentional dictation errors.    Joni Reining, PA-C 08/30/16 1530    Willy Eddy, MD 08/31/16 [phone]

## 2016-09-09 ENCOUNTER — Encounter: Payer: Self-pay | Admitting: Emergency Medicine

## 2016-09-09 ENCOUNTER — Emergency Department
Admission: EM | Admit: 2016-09-09 | Discharge: 2016-09-09 | Disposition: A | Discharge status: Home / Self Care | Payer: Medicaid Other | Attending: Emergency Medicine | Admitting: Emergency Medicine

## 2016-09-09 DIAGNOSIS — F17210 Nicotine dependence, cigarettes, uncomplicated: Secondary | ICD-10-CM | POA: Insufficient documentation

## 2016-09-09 DIAGNOSIS — Z79899 Other long term (current) drug therapy: Secondary | ICD-10-CM | POA: Insufficient documentation

## 2016-09-09 DIAGNOSIS — R1110 Vomiting, unspecified: Secondary | ICD-10-CM

## 2016-09-09 DIAGNOSIS — G43009 Migraine without aura, not intractable, without status migrainosus: Secondary | ICD-10-CM | POA: Insufficient documentation

## 2016-09-09 DIAGNOSIS — I10 Essential (primary) hypertension: Secondary | ICD-10-CM | POA: Insufficient documentation

## 2016-09-09 LAB — COMPREHENSIVE METABOLIC PANEL
ALT: 26 U/L (ref 14–54)
AST: 23 U/L (ref 15–41)
Albumin: 4.6 g/dL (ref 3.5–5.0)
Alkaline Phosphatase: 96 U/L (ref 38–126)
Anion gap: 8 (ref 5–15)
BUN: 8 mg/dL (ref 6–20)
CO2: 27 mmol/L (ref 22–32)
CREATININE: 0.66 mg/dL (ref 0.44–1.00)
Calcium: 9.7 mg/dL (ref 8.9–10.3)
Chloride: 105 mmol/L (ref 101–111)
GFR calc non Af Amer: >60 mL/min (ref >60)
Glucose, Bld: 117 mg/dL — ABNORMAL HIGH (ref 65–99)
POTASSIUM: 4.1 mmol/L (ref 3.5–5.1)
SODIUM: 140 mmol/L (ref 135–145)
Total Bilirubin: 0.5 mg/dL (ref 0.3–1.2)
Total Protein: 7.9 g/dL (ref 6.5–8.1)

## 2016-09-09 LAB — CBC
HEMATOCRIT: 38.5 % (ref 35.0–47.0)
Hemoglobin: 12.9 g/dL (ref 12.0–16.0)
MCH: 25.1 pg — AB (ref 26.0–34.0)
MCHC: 33.6 g/dL (ref 32.0–36.0)
MCV: 74.9 fL — AB (ref 80.0–100.0)
PLATELETS: 325 10*3/uL (ref 150–440)
RBC: 5.14 MIL/uL (ref 3.80–5.20)
RDW: 14.1 % (ref 11.5–14.5)
WBC: 12.1 10*3/uL — ABNORMAL HIGH (ref 3.6–11.0)

## 2016-09-09 LAB — URINALYSIS, COMPLETE (UACMP) WITH MICROSCOPIC
Bilirubin Urine: NEGATIVE
Glucose, UA: NEGATIVE mg/dL
Hgb urine dipstick: NEGATIVE
KETONES UR: NEGATIVE mg/dL
Leukocytes, UA: NEGATIVE
Nitrite: NEGATIVE
PH: 8 (ref 5.0–8.0)
PROTEIN: 100 mg/dL — AB
Specific Gravity, Urine: 1.021 (ref 1.005–1.030)

## 2016-09-09 LAB — LIPASE, BLOOD: LIPASE: 17 U/L (ref 11–51)

## 2016-09-09 LAB — POCT PREGNANCY, URINE: PREG TEST UR: NEGATIVE

## 2016-09-09 MED ORDER — ONDANSETRON 4 MG PO TBDP
4.0000 mg | ORAL_TABLET | Freq: Once | ORAL | Status: AC | PRN
  Administered 2016-09-09: 4 mg via ORAL

## 2016-09-09 MED ORDER — PROCHLORPERAZINE MALEATE 10 MG PO TABS
10.0000 mg | ORAL_TABLET | Freq: Once | ORAL | Status: AC
  Administered 2016-09-09: 10 mg via ORAL
  Filled 2016-09-09: qty 1

## 2016-09-09 MED ORDER — PROCHLORPERAZINE MALEATE 10 MG PO TABS: 10.0000 mg | ORAL_TABLET | Freq: Four times a day (QID) | ORAL | 0 refills | Status: DC | PRN

## 2016-09-09 MED ORDER — ONDANSETRON 4 MG PO TBDP
ORAL_TABLET | ORAL | Status: AC
  Administered 2016-09-09: 4 mg via ORAL
  Filled 2016-09-09: qty 1

## 2016-09-09 MED ORDER — DIPHENHYDRAMINE HCL 25 MG PO CAPS
25.0000 mg | ORAL_CAPSULE | Freq: Once | ORAL | Status: AC
  Administered 2016-09-09: 25 mg via ORAL
  Filled 2016-09-09: qty 1

## 2016-09-09 NOTE — ED Triage Notes (Signed)
Pt in via POV with complaints of N/V/D, chills, headache x one day.  Pt vomiting in triage room, hypertensive with hx of same, other vitals WDL.  NAD noted at this time.

## 2016-09-09 NOTE — ED Provider Notes (Signed)
[facility] Emergency Department Provider Note       Time seen: ----------------------------------------- 2:52 PM on 09/09/2016 -----------------------------------------     I have reviewed the triage vital signs and the nursing notes.   HISTORY   Chief Complaint Emesis    HPI Paula Keller is a 26 y.o. female who presents to the ED for nausea, vomiting, diarrhea, chills and headache for the last day. Patient was vomiting in triage room, was noted to be hypertensive and does have a history of same. She denies fevers, has recently been seen for same with no specific diagnosis. Recently she thought she was pregnant   Past Medical History:  Diagnosis Date  . Hypertension     There are no active problems to display for this patient.   Past Surgical History:  Procedure Laterality Date  . TONSILLECTOMY      Allergies Amoxicillin  Social History Social History  Substance Use Topics  . Smoking status: Current Every Day Smoker    Packs/day: 0.50    Types: Cigarettes  . Smokeless tobacco: Never Used  . Alcohol use Yes    Review of Systems Constitutional: Negative for fever.Positive for chills Eyes: Negative for vision changes ENT:  Negative for congestion, sore throat Cardiovascular: Negative for chest pain. Respiratory: Negative for shortness of breath. Gastrointestinal: Positive for abdominal pain, vomiting and diarrhea Genitourinary: Negative for dysuria. Musculoskeletal: Negative for back pain. Skin: Negative for rash. Neurological: Positive for headache  All systems negative/normal/unremarkable except as stated in the HPI  ____________________________________________   PHYSICAL EXAM:  VITAL SIGNS: ED Triage Vitals  Enc Vitals Group     BP 09/09/16 1307 (!) 134/100     Pulse Rate 09/09/16 1307 90     Resp 09/09/16 1307 20     Temp 09/09/16 1307 98.5 F (36.9 C)     Temp Source 09/09/16 1307 Oral     SpO2  09/09/16 1307 99 %     Weight 09/09/16 1307 245 lb (111.1 kg)     Height 09/09/16 1307  (1.626 m)     Head Circumference --      Peak Flow --      Pain Score 09/09/16 1306 6     Pain Loc --      Pain Edu? --      Excl. in GC? --     Constitutional: Alert and oriented. Well appearing and in no distress. Eyes: Conjunctivae are normal. PERRL. Normal extraocular movements. ENT   Head: Normocephalic and atraumatic.   Nose: No congestion/rhinnorhea.   Mouth/Throat: Mucous membranes are moist.   Neck: No stridor. Cardiovascular: Normal rate, regular rhythm. No murmurs, rubs, or gallops. Respiratory: Normal respiratory effort without tachypnea nor retractions. Breath sounds are clear and equal bilaterally. No wheezes/rales/rhonchi. Gastrointestinal: Soft and nontender. Normal bowel sounds Musculoskeletal: Nontender with normal range of motion in extremities. No lower extremity tenderness nor edema. Neurologic:  Normal speech and language. No gross focal neurologic deficits are appreciated.  Skin:  Skin is warm, dry and intact. No rash noted. Psychiatric: Mood and affect are normal. Speech and behavior are normal.  ____________________________________________  ED COURSE:  Pertinent labs & imaging results that were available during my care of the patient were reviewed by me and considered in my medical decision making (see chart for details). Patient presents for frequent vomiting and has recently been seen for same, we will assess with labs and imaging as indicated.   Procedures ____________________________________________   LABS (  pertinent positives/negatives)  Labs Reviewed  COMPREHENSIVE METABOLIC PANEL - Abnormal; Notable for the following:       Result Value   Glucose, Bld 117 (*)    All other components within normal limits  CBC - Abnormal; Notable for the following:    WBC 12.1 (*)    MCV 74.9 (*)    MCH 25.1 (*)    All other components within normal  limits  URINALYSIS, COMPLETE (UACMP) WITH MICROSCOPIC - Abnormal; Notable for the following:    Color, Urine YELLOW (*)    APPearance CLOUDY (*)    Protein, ur 100 (*)    Bacteria, UA RARE (*)    Squamous Epithelial / LPF 6-30 (*)    All other components within normal limits  LIPASE, BLOOD  POC URINE PREG, ED  POCT PREGNANCY, URINE   ____________________________________________  FINAL ASSESSMENT AND PLAN  Vomiting  Plan: Patient's labs were dictated above. Patient had presented for Vomiting of unclear etiology. She also has frequent migraines and so she was given a dose of Compazine here which has helped both. I will prescribe a short supply of same to take at home as needed.   Emily Filbert, MD   Note: This note was generated in part or whole with voice recognition software. Voice recognition is usually quite accurate but there are transcription errors that can and very often do occur. I apologize for any typographical errors that were not detected and corrected.     Emily Filbert, MD 09/09/16 [phone]

## 2016-11-27 ENCOUNTER — Encounter: Payer: Self-pay | Admitting: Emergency Medicine

## 2016-11-27 ENCOUNTER — Emergency Department
Admission: EM | Admit: 2016-11-27 | Discharge: 2016-11-27 | Disposition: A | Discharge status: Home / Self Care | Payer: Self-pay | Attending: Emergency Medicine | Admitting: Emergency Medicine

## 2016-11-27 DIAGNOSIS — Z79899 Other long term (current) drug therapy: Secondary | ICD-10-CM | POA: Insufficient documentation

## 2016-11-27 DIAGNOSIS — F17210 Nicotine dependence, cigarettes, uncomplicated: Secondary | ICD-10-CM | POA: Insufficient documentation

## 2016-11-27 DIAGNOSIS — R112 Nausea with vomiting, unspecified: Secondary | ICD-10-CM | POA: Insufficient documentation

## 2016-11-27 DIAGNOSIS — R197 Diarrhea, unspecified: Secondary | ICD-10-CM | POA: Insufficient documentation

## 2016-11-27 DIAGNOSIS — I10 Essential (primary) hypertension: Secondary | ICD-10-CM | POA: Insufficient documentation

## 2016-11-27 LAB — POCT PREGNANCY, URINE: Preg Test, Ur: NEGATIVE

## 2016-11-27 LAB — CBC
HCT: 35.8 % (ref 35.0–47.0)
Hemoglobin: 12.6 g/dL (ref 12.0–16.0)
MCH: 26.5 pg (ref 26.0–34.0)
MCHC: 35.3 g/dL (ref 32.0–36.0)
MCV: 74.9 fL — ABNORMAL LOW (ref 80.0–100.0)
PLATELETS: 249 10*3/uL (ref 150–440)
RBC: 4.78 MIL/uL (ref 3.80–5.20)
RDW: 14.2 % (ref 11.5–14.5)
WBC: 7.8 10*3/uL (ref 3.6–11.0)

## 2016-11-27 LAB — COMPREHENSIVE METABOLIC PANEL
ALBUMIN: 4.1 g/dL (ref 3.5–5.0)
ALK PHOS: 88 U/L (ref 38–126)
ALT: 20 U/L (ref 14–54)
ANION GAP: 7 (ref 5–15)
AST: 23 U/L (ref 15–41)
BILIRUBIN TOTAL: 0.3 mg/dL (ref 0.3–1.2)
BUN: 8 mg/dL (ref 6–20)
CALCIUM: 9.2 mg/dL (ref 8.9–10.3)
CO2: 24 mmol/L (ref 22–32)
Chloride: 110 mmol/L (ref 101–111)
Creatinine, Ser: 0.63 mg/dL (ref 0.44–1.00)
GFR calc Af Amer: >60 mL/min (ref >60)
GFR calc non Af Amer: >60 mL/min (ref >60)
GLUCOSE: 115 mg/dL — AB (ref 65–99)
Potassium: 3.8 mmol/L (ref 3.5–5.1)
SODIUM: 141 mmol/L (ref 135–145)
TOTAL PROTEIN: 7.1 g/dL (ref 6.5–8.1)

## 2016-11-27 LAB — URINALYSIS, ROUTINE W REFLEX MICROSCOPIC
BILIRUBIN URINE: NEGATIVE
Bacteria, UA: NONE SEEN
Glucose, UA: NEGATIVE mg/dL
HGB URINE DIPSTICK: NEGATIVE
Ketones, ur: NEGATIVE mg/dL
LEUKOCYTES UA: NEGATIVE
NITRITE: NEGATIVE
PH: 5 (ref 5.0–8.0)
Protein, ur: 30 mg/dL — AB
SPECIFIC GRAVITY, URINE: 1.025 (ref 1.005–1.030)

## 2016-11-27 LAB — LIPASE, BLOOD: Lipase: 29 U/L (ref 11–51)

## 2016-11-27 MED ORDER — SODIUM CHLORIDE 0.9 % IV BOLUS (SEPSIS)
1000.0000 mL | Freq: Once | INTRAVENOUS | Status: AC
  Administered 2016-11-27: 1000 mL via INTRAVENOUS

## 2016-11-27 MED ORDER — ONDANSETRON 4 MG PO TBDP: 4.0000 mg | ORAL_TABLET | Freq: Four times a day (QID) | ORAL | 0 refills | Status: DC | PRN

## 2016-11-27 MED ORDER — ONDANSETRON HCL 4 MG/2ML IJ SOLN
4.0000 mg | Freq: Once | INTRAMUSCULAR | Status: AC
  Administered 2016-11-27: 4 mg via INTRAVENOUS
  Filled 2016-11-27: qty 2

## 2016-11-27 NOTE — Discharge Instructions (Signed)
It is important that you follow up closely with your primary care doctor in the next couple of days.  If you're unable to see her primary care doctor you may return to the emergency room or go to the [facility] walk-in clinic in 1 or 2 days for reexam.  Please return to the emergency room right away if you are to develop a fever, severe nausea, your pain becomes severe or worsens, you are unable to keep food down, begin vomiting any dark or bloody fluid, you develop any dark or bloody stools, feel dehydrated, or other new concerns or symptoms arise.

## 2016-11-27 NOTE — ED Triage Notes (Signed)
Pt here for vomiting that started this morning. Ambulatory to triage. Some diarrhea per pt. No abdominal pain. When asked if could be pregnant states "it is not prevented". Last period only spotting. NAD. VSS

## 2016-11-27 NOTE — ED Provider Notes (Signed)
[facility] Emergency Department Provider Note   ____________________________________________   First MD Initiated Contact with Patient 11/27/16 1100     (approximate)  I have reviewed the triage vital signs and the nursing notes.   HISTORY  Chief Complaint Abdominal Pain   HPI Paula Keller is a 26 y.o. female here for evaluation of a couple episodes of vomiting. Also nausea this morning.  Woke up about 4 AM with nausea, vomited twice she describes as watery. She since been able to eat and drink. Went to work and felt nauseated and was sent home. Denies pain. No chest pain or shortness of breath. No abdominal pain. Last menstrual cycle was a couple weeks ago described as "spotting". Does not believe she is pregnant but is not using contraceptive.  Denies previous abdominal surgery.   Past Medical History:  Diagnosis Date  . Hypertension     There are no active problems to display for this patient.   Past Surgical History:  Procedure Laterality Date  . TONSILLECTOMY      Prior to Admission medications   Medication Sig Start Date End Date Taking? Authorizing Provider  albuterol (PROVENTIL HFA;VENTOLIN HFA) 108 (90 Base) MCG/ACT inhaler Inhale 2 puffs into the lungs every 4 (four) hours as needed for wheezing or shortness of breath. 06/30/15   Cuthriell, Delorise RoyalsJonathan D, PA-C  hydrochlorothiazide (MICROZIDE) 12.5 MG capsule Take 1 capsule (12.5 mg total) by mouth daily. 07/22/16   Myrna BlazerSchaevitz, David Matthew, MD  ondansetron (ZOFRAN ODT) 4 MG disintegrating tablet Take 1 tablet (4 mg total) by mouth every 6 (six) hours as needed for nausea or vomiting. 11/27/16   Sharyn CreamerQuale, Mark, MD    Allergies Amoxicillin  Family History  Problem Relation Age of Onset  . Adopted: Yes    Social History Social History  Substance Use Topics  . Smoking status: Current Every Day Smoker    Packs/day: 0.50    Types: Cigarettes  . Smokeless tobacco: Never Used    . Alcohol use Yes    Review of Systems Constitutional: No fever/chills Eyes: No visual changes. ENT: No sore throat. Cardiovascular: Denies chest pain. Respiratory: Denies shortness of breath. Gastrointestinal: No abdominal pain.  One loose stool this morning. No constipation. Genitourinary: Negative for dysuria. Musculoskeletal: Negative for back pain. Skin: Negative for rash. Neurological: Negative for headaches, focal weakness or numbness.  Denies travel history   ____________________________________________   PHYSICAL EXAM:  VITAL SIGNS: ED Triage Vitals  Enc Vitals Group     BP 11/27/16 1055 (!) 146/86     Pulse Rate 11/27/16 1055 89     Resp 11/27/16 1055 20     Temp 11/27/16 1055 98.7 F (37.1 C)     Temp Source 11/27/16 1055 Oral     SpO2 11/27/16 1055 97 %     Weight 11/27/16 1054 240 lb (108.9 kg)     Height 11/27/16 1054 5\' 4"  (1.626 m)     Head Circumference --      Peak Flow --      Pain Score --      Pain Loc --      Pain Edu? --      Excl. in GC? --     Constitutional: Alert and oriented. Well appearing and in no acute distress. Eyes: Conjunctivae are normal. Head: Atraumatic. Nose: No congestion/rhinnorhea. Mouth/Throat: Mucous membranes are moist. Neck: No stridor.   Cardiovascular: Normal rate, regular rhythm. Grossly normal heart sounds.  Good peripheral  circulation. Respiratory: Normal respiratory effort.  No retractions. Lungs CTAB. Gastrointestinal: Soft and nontender. No distention.Negative Murphy. No pain in McBurney's point. No rebound guarding or distention in any quadrant. No hernias. Musculoskeletal: No lower extremity tenderness nor edema. Neurologic:  Normal speech and language. No gross focal neurologic deficits are appreciated.  Skin:  Skin is warm, dry and intact. No rash noted. Psychiatric: Mood and affect are normal. Speech and behavior are normal.  ____________________________________________   LABS (all labs ordered  are listed, but only abnormal results are displayed)  Labs Reviewed  CBC - Abnormal; Notable for the following:       Result Value   MCV 74.9 (*)    All other components within normal limits  URINALYSIS, ROUTINE W REFLEX MICROSCOPIC - Abnormal; Notable for the following:    Color, Urine YELLOW (*)    APPearance CLOUDY (*)    Protein, ur 30 (*)    Squamous Epithelial / LPF 6-30 (*)    All other components within normal limits  COMPREHENSIVE METABOLIC PANEL - Abnormal; Notable for the following:    Glucose, Bld 115 (*)    All other components within normal limits  LIPASE, BLOOD  POCT PREGNANCY, URINE  POC URINE PREG, ED   ____________________________________________  EKG   ____________________________________________  RADIOLOGY  No results found.  ____________________________________________   PROCEDURES  Procedure(s) performed: None  Procedures  Critical Care performed: No  ____________________________________________   INITIAL IMPRESSION / ASSESSMENT AND PLAN / ED COURSE  Pertinent labs & imaging results that were available during my care of the patient were reviewed by me and considered in my medical decision making (see chart for details).  Nontoxic well appearing. Nontender abdomen. Reassuring labs and examination.  Clinical exam and history seem to suggest likely self-limited gastrointestinal illness. No peritonitis or evidence acute abdomen. No neurologic cardiac or pulmonary symptoms    ----------------------------------------- 12:04 PM on 11/27/2016 -----------------------------------------  I discussed with the patient the risks and benefits of abdominal CT scan. The present time there is no clear indication that the patient requires CT, the patient does have an abdominal complaint but exam does not suggest acute surgical abdomen and my suspicion for intra-abdominal infection including appendicitis, cholecystitis, aaa, dissection, ischemia,  perforation, pancreatitis, diverticulitis or other acute major intra-abdominal process is quite low. After discussing the risks and benefits including benefits of additional evaluation for diagnoses, ruling out infection/perforation/aaa/etc, but also discussing the risks including low, "well less than 1%," but not 0 risk of inducing cancers due to radiation and potential risks of contrast the patient indicated via our shared medical decision-making that she would not do a CAT scan. Rather if the patient does have worsening symptoms, develops a high fever, develops pain or persistent discomfort in the right upper quadrant or right lower quadrant, or other new concerns arise they will come back to emergency room right away. As the patient's clinician I think this is a very reasonable decision having discussed general risks and benefits of CT, and my clinical suspicion that CT would be of benefit at this time is very low.  ----------------------------------------- 12:48 PM on 11/27/2016 -----------------------------------------  Patient reports she feels better. No ongoing nausea. Resting comfortably. Reexam abdomen soft in all quadrants without tenderness.  Return precautions and treatment recommendations and follow-up discussed with the patient who is agreeable with the plan.  ____________________________________________   FINAL CLINICAL IMPRESSION(S) / ED DIAGNOSES  Final diagnoses:  Nausea vomiting and diarrhea      NEW MEDICATIONS STARTED  DURING THIS VISIT:  New Prescriptions   ONDANSETRON (ZOFRAN ODT) 4 MG DISINTEGRATING TABLET    Take 1 tablet (4 mg total) by mouth every 6 (six) hours as needed for nausea or vomiting.     Note:  This document was prepared using Dragon voice recognition software and may include unintentional dictation errors.     Sharyn Creamer, MD 11/27/16 1248

## 2017-06-20 ENCOUNTER — Other Ambulatory Visit: Payer: Self-pay

## 2017-06-20 ENCOUNTER — Encounter (HOSPITAL_COMMUNITY): Payer: Self-pay | Admitting: Emergency Medicine

## 2017-06-20 ENCOUNTER — Emergency Department (HOSPITAL_COMMUNITY)
Admission: EM | Admit: 2017-06-20 | Discharge: 2017-06-20 | Disposition: A | Discharge status: Home / Self Care | Payer: Self-pay | Attending: Physician Assistant | Admitting: Physician Assistant

## 2017-06-20 DIAGNOSIS — F17210 Nicotine dependence, cigarettes, uncomplicated: Secondary | ICD-10-CM | POA: Insufficient documentation

## 2017-06-20 DIAGNOSIS — I10 Essential (primary) hypertension: Secondary | ICD-10-CM | POA: Insufficient documentation

## 2017-06-20 DIAGNOSIS — F32A Depression, unspecified: Secondary | ICD-10-CM

## 2017-06-20 DIAGNOSIS — Z79899 Other long term (current) drug therapy: Secondary | ICD-10-CM | POA: Insufficient documentation

## 2017-06-20 DIAGNOSIS — Z880 Allergy status to penicillin: Secondary | ICD-10-CM | POA: Insufficient documentation

## 2017-06-20 DIAGNOSIS — F331 Major depressive disorder, recurrent, moderate: Secondary | ICD-10-CM | POA: Insufficient documentation

## 2017-06-20 DIAGNOSIS — F329 Major depressive disorder, single episode, unspecified: Secondary | ICD-10-CM

## 2017-06-20 LAB — CBC
HCT: 40.1 % (ref 36.0–46.0)
HEMOGLOBIN: 13.2 g/dL (ref 12.0–15.0)
MCH: 26.1 pg (ref 26.0–34.0)
MCHC: 32.9 g/dL (ref 30.0–36.0)
MCV: 79.4 fL (ref 78.0–100.0)
PLATELETS: 295 10*3/uL (ref 150–400)
RBC: 5.05 MIL/uL (ref 3.87–5.11)
RDW: 13.1 % (ref 11.5–15.5)
WBC: 7.6 10*3/uL (ref 4.0–10.5)

## 2017-06-20 LAB — COMPREHENSIVE METABOLIC PANEL
ALT: 22 U/L (ref 14–54)
ANION GAP: 12 (ref 5–15)
AST: 20 U/L (ref 15–41)
Albumin: 4.1 g/dL (ref 3.5–5.0)
Alkaline Phosphatase: 94 U/L (ref 38–126)
BUN: 5 mg/dL — ABNORMAL LOW (ref 6–20)
CHLORIDE: 105 mmol/L (ref 101–111)
CO2: 23 mmol/L (ref 22–32)
Calcium: 9.4 mg/dL (ref 8.9–10.3)
Creatinine, Ser: 0.78 mg/dL (ref 0.44–1.00)
GFR calc Af Amer: >60 mL/min (ref >60)
GFR calc non Af Amer: >60 mL/min (ref >60)
Glucose, Bld: 118 mg/dL — ABNORMAL HIGH (ref 65–99)
POTASSIUM: 3.6 mmol/L (ref 3.5–5.1)
SODIUM: 140 mmol/L (ref 135–145)
Total Bilirubin: 0.5 mg/dL (ref 0.3–1.2)
Total Protein: 6.9 g/dL (ref 6.5–8.1)

## 2017-06-20 LAB — ETHANOL: Alcohol, Ethyl (B): <10 mg/dL (ref <10)

## 2017-06-20 LAB — I-STAT BETA HCG BLOOD, ED (MC, WL, AP ONLY): I-stat hCG, quantitative: <5 m[IU]/mL (ref <5)

## 2017-06-20 LAB — RAPID URINE DRUG SCREEN, HOSP PERFORMED
AMPHETAMINES: NOT DETECTED
Barbiturates: POSITIVE — AB
Benzodiazepines: NOT DETECTED
COCAINE: NOT DETECTED
Opiates: NOT DETECTED
TETRAHYDROCANNABINOL: POSITIVE — AB

## 2017-06-20 LAB — ACETAMINOPHEN LEVEL

## 2017-06-20 LAB — SALICYLATE LEVEL

## 2017-06-20 NOTE — ED Notes (Signed)
Warm blanket given as requested

## 2017-06-20 NOTE — ED Provider Notes (Signed)
[facility] EMERGENCY DEPARTMENT Provider Note   CSN: [id number] Arrival date & time: 06/20/17  0857     History   Chief Complaint Chief Complaint  Patient presents with  . Suicidal    HPI Paula Keller is a 27 y.o. female.  HPI   27 year old female presents today with complaints of severe depression.  Patient notes that over the last several months she has had worsening depressive symptoms to the point where yesterday she was thinking about who would take her children if she was no longer alive.  She reports that she thought about how they would be okay if she was not here as she can no longer handle the depression.  She denies any plans of harming herself, no attempts.  She notes she has had significant depression over the years.  Patient denies any physical complaints here today.    Past Medical History:  Diagnosis Date  . Hypertension     There are no active problems to display for this patient.   Past Surgical History:  Procedure Laterality Date  . TONSILLECTOMY      OB History    Gravida Para Term Preterm AB Living   0             SAB TAB Ectopic Multiple Live Births                   Home Medications    Prior to Admission medications   Medication Sig Start Date End Date Taking? Authorizing Provider  albuterol (PROVENTIL HFA;VENTOLIN HFA) 108 (90 Base) MCG/ACT inhaler Inhale 2 puffs into the lungs every 4 (four) hours as needed for wheezing or shortness of breath. 06/30/15   Cuthriell, Delorise RoyalsJonathan D, PA-C  hydrochlorothiazide (MICROZIDE) 12.5 MG capsule Take 1 capsule (12.5 mg total) by mouth daily. 07/22/16   Myrna BlazerSchaevitz, David Matthew, MD  ondansetron (ZOFRAN ODT) 4 MG disintegrating tablet Take 1 tablet (4 mg total) by mouth every 6 (six) hours as needed for nausea or vomiting. 11/27/16   Sharyn CreamerQuale, Mark, MD    Family History Family History  Adopted: Yes    Social History Social History   Tobacco Use  . Smoking status: Current  Every Day Smoker    Packs/day: 0.50    Types: Cigarettes  . Smokeless tobacco: Never Used  Substance Use Topics  . Alcohol use: No    Frequency: Never  . Drug use: Yes    Types: Marijuana     Allergies   Amoxicillin  Review of Systems Review of Systems  All other systems reviewed and are negative.   Physical Exam Updated Vital Signs BP (!) 153/92 (BP Location: Right Arm)   Pulse (!) 59   Temp 98.2 F (36.8 C) (Oral)   Resp 18   Ht 5\' 4"  (1.626 m)   Wt 109.8 kg (242 lb)   SpO2 98%   BMI 41.54 kg/m    Physical Exam  Constitutional: She is oriented to person, place, and time. She appears well-developed and well-nourished.  HENT:  Head: Normocephalic and atraumatic.  Eyes: Conjunctivae are normal. Pupils are equal, round, and reactive to light. Right eye exhibits no discharge. Left eye exhibits no discharge. No scleral icterus.  Neck: Normal range of motion. No JVD present. No tracheal deviation present.  Cardiovascular: Normal rate and regular rhythm.  Pulmonary/Chest: Effort normal and breath sounds normal. No stridor. No respiratory distress. She has no wheezes. She has no rales. She exhibits no tenderness.  Neurological: She is alert and oriented to person, place, and time. Coordination normal.  Psychiatric: She has a normal mood and affect. Her behavior is normal. Judgment and thought content normal.  Nursing note and vitals reviewed.    ED Treatments / Results  Labs (all labs ordered are listed, but only abnormal results are displayed) Labs Reviewed  COMPREHENSIVE METABOLIC PANEL - Abnormal; Notable for the following components:      Result Value   Glucose, Bld 118 (*)    BUN 5 (*)    All other components within normal limits  ACETAMINOPHEN LEVEL - Abnormal; Notable for the following components:   Acetaminophen (Tylenol), Serum <10 (*)    All other components within normal limits  RAPID URINE DRUG SCREEN, HOSP PERFORMED - Abnormal; Notable for the  following components:   Tetrahydrocannabinol POSITIVE (*)    Barbiturates POSITIVE (*)    All other components within normal limits  ETHANOL  SALICYLATE LEVEL  CBC  I-STAT BETA HCG BLOOD, ED ([facility], [facility], [facility] ONLY)    EKG  EKG Interpretation None       Radiology No results found.  Procedures Procedures (including critical care time)  Medications Ordered in ED Medications - No data to display   Initial Impression / Assessment and Plan / ED Course  I have reviewed the triage vital signs and the nursing notes.  Pertinent labs & imaging results that were available during my care of the patient were reviewed by me and considered in my medical decision making (see chart for details).    Final Clinical Impressions(s) / ED Diagnoses   Final diagnoses:  Depression, unspecified depression type    27 year old female presents today with severe depression.  She has no intent on harming herself but has vague ideations.  Patient without any homicidal ideations and no physical complaints.  She is medically cleared awaiting TTS evaluation.  TTS evaluated patient and recommended that she be safe for outpatient follow-up.  I find this reasonable patient has no suicidal plan, she was discharged with strict return precautions and follow-up information.  No further questions or concerns at the time of discharge.   ED Discharge Orders    None       Rosalio Loud 06/20/17 1914    Abelino Derrick, MD 06/22/17 [phone]

## 2017-06-20 NOTE — ED Notes (Signed)
TTS being performed.  

## 2017-06-20 NOTE — Discharge Instructions (Signed)
Please follow-up with outpatient resources given.  Please return immediately if develop any new or worsening signs or's.
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
## 2017-06-20 NOTE — ED Notes (Signed)
Pt's belongings inventoried by Edd ArbourKendie, Charity fundraiserN - 1 labeled belongings bag placed in [employer] [facility] - Valuables w/Security - Home Meds to Pharmacy.

## 2017-06-20 NOTE — ED Notes (Signed)
Outpt Resources discussed and given to pt - pt voiced understanding to follow up as recommended. Pt denies SI/HI.

## 2017-06-20 NOTE — ED Notes (Signed)
Medication with pharmacy, patient wallet, keys and cell phone in safe.

## 2017-06-20 NOTE — ED Triage Notes (Signed)
Pt states for the last 48 hours she has been in a state of depression and last night began to have thoughts of taking her life but first needed to make arrangements for her children but decided this morning she should come here first to try and speak with someone. Pt is tearful in triage but calm and cooperative.

## 2018-06-18 ENCOUNTER — Other Ambulatory Visit: Payer: Self-pay

## 2018-06-18 ENCOUNTER — Encounter: Payer: Self-pay | Admitting: Psychiatry

## 2018-06-18 ENCOUNTER — Inpatient Hospital Stay
Admission: AD | Admit: 2018-06-18 | Discharge: 2018-06-20 | DRG: 885 | Disposition: A | Discharge status: Home / Self Care | Payer: No Typology Code available for payment source | Attending: Psychiatry | Admitting: Psychiatry

## 2018-06-18 DIAGNOSIS — I10 Essential (primary) hypertension: Secondary | ICD-10-CM | POA: Diagnosis present

## 2018-06-18 DIAGNOSIS — Z880 Allergy status to penicillin: Secondary | ICD-10-CM | POA: Diagnosis not present

## 2018-06-18 DIAGNOSIS — R45851 Suicidal ideations: Secondary | ICD-10-CM | POA: Diagnosis present

## 2018-06-18 DIAGNOSIS — O10919 Unspecified pre-existing hypertension complicating pregnancy, unspecified trimester: Secondary | ICD-10-CM | POA: Diagnosis present

## 2018-06-18 DIAGNOSIS — F4310 Post-traumatic stress disorder, unspecified: Secondary | ICD-10-CM | POA: Diagnosis present

## 2018-06-18 DIAGNOSIS — F1220 Cannabis dependence, uncomplicated: Secondary | ICD-10-CM | POA: Diagnosis present

## 2018-06-18 DIAGNOSIS — F17210 Nicotine dependence, cigarettes, uncomplicated: Secondary | ICD-10-CM | POA: Diagnosis present

## 2018-06-18 DIAGNOSIS — F4010 Social phobia, unspecified: Secondary | ICD-10-CM | POA: Diagnosis present

## 2018-06-18 DIAGNOSIS — Z79899 Other long term (current) drug therapy: Secondary | ICD-10-CM | POA: Diagnosis not present

## 2018-06-18 DIAGNOSIS — F315 Bipolar disorder, current episode depressed, severe, with psychotic features: Secondary | ICD-10-CM | POA: Diagnosis present

## 2018-06-18 DIAGNOSIS — F429 Obsessive-compulsive disorder, unspecified: Secondary | ICD-10-CM | POA: Diagnosis present

## 2018-06-18 DIAGNOSIS — F410 Panic disorder [episodic paroxysmal anxiety] without agoraphobia: Secondary | ICD-10-CM | POA: Diagnosis present

## 2018-06-18 DIAGNOSIS — F17200 Nicotine dependence, unspecified, uncomplicated: Secondary | ICD-10-CM | POA: Diagnosis present

## 2018-06-18 MED ORDER — ALBUTEROL SULFATE HFA 108 (90 BASE) MCG/ACT IN AERS
2.0000 | INHALATION_SPRAY | RESPIRATORY_TRACT | Status: DC | PRN
  Filled 2018-06-18: qty 6.7

## 2018-06-18 MED ORDER — ALUM & MAG HYDROXIDE-SIMETH 200-200-20 MG/5ML PO SUSP: 30.0000 mL | ORAL | Status: DC | PRN

## 2018-06-18 MED ORDER — NICOTINE 14 MG/24HR TD PT24
14.0000 mg | MEDICATED_PATCH | Freq: Every day | TRANSDERMAL | Status: DC
  Administered 2018-06-19 – 2018-06-20 (×2): 14 mg via TRANSDERMAL
  Filled 2018-06-18 (×2): qty 1

## 2018-06-18 MED ORDER — DIPHENHYDRAMINE HCL 25 MG PO CAPS: 25.0000 mg | ORAL_CAPSULE | Freq: Four times a day (QID) | ORAL | Status: DC | PRN

## 2018-06-18 MED ORDER — HYDROXYZINE HCL 25 MG PO TABS
25.0000 mg | ORAL_TABLET | Freq: Four times a day (QID) | ORAL | Status: DC | PRN
  Administered 2018-06-18: 25 mg via ORAL
  Filled 2018-06-18: qty 1

## 2018-06-18 MED ORDER — TRAZODONE HCL 50 MG PO TABS
50.0000 mg | ORAL_TABLET | Freq: Every evening | ORAL | Status: DC | PRN
  Administered 2018-06-18 – 2018-06-19 (×2): 50 mg via ORAL
  Filled 2018-06-18 (×2): qty 1

## 2018-06-18 MED ORDER — ONDANSETRON 4 MG PO TBDP: 4.0000 mg | ORAL_TABLET | Freq: Four times a day (QID) | ORAL | Status: DC | PRN

## 2018-06-18 MED ORDER — HYDROCHLOROTHIAZIDE 12.5 MG PO CAPS
12.5000 mg | ORAL_CAPSULE | Freq: Every day | ORAL | Status: DC
  Administered 2018-06-19 – 2018-06-20 (×2): 12.5 mg via ORAL
  Filled 2018-06-18 (×2): qty 1

## 2018-06-18 MED ORDER — ACETAMINOPHEN 325 MG PO TABS: 650.0000 mg | ORAL_TABLET | Freq: Four times a day (QID) | ORAL | Status: DC | PRN

## 2018-06-18 MED ORDER — MAGNESIUM HYDROXIDE 400 MG/5ML PO SUSP
30.0000 mL | Freq: Every day | ORAL | Status: DC | PRN
  Administered 2018-06-20: 30 mL via ORAL
  Filled 2018-06-18: qty 30

## 2018-06-18 NOTE — Tx Team (Signed)
Initial Treatment Plan 06/18/2018 10:14 PM [name] KZS:[id number]    PATIENT STRESSORS: Financial difficulties Marital or family conflict Medication change or noncompliance Other: suicidal thoughts, depression, anxiety    PATIENT STRENGTHS: Ability for insight Active sense of humor Average or above average intelligence Capable of independent living Communication skills General fund of knowledge Motivation for treatment/growth Physical Health Special hobby/interest Supportive family/friends Work skills   PATIENT IDENTIFIED PROBLEMS: Suicidal thoughts 06/18/2018  Depression 06/18/2018  Medication regimen 06/18/2018  Anxiety 06/18/2018               DISCHARGE CRITERIA:  Improved stabilization in mood, thinking, and/or behavior Motivation to continue treatment in a less acute level of care Need for constant or close observation no longer present Verbal commitment to aftercare and medication compliance  PRELIMINARY DISCHARGE PLAN: Outpatient therapy Participate in family therapy Return to previous living arrangement Return to previous work or school arrangements  PATIENT/FAMILY INVOLVEMENT: This treatment plan has been presented to and reviewed with the patient, Paula Keller. The patient has been given the opportunity to ask questions and make suggestions.  Lenox Ponds, RN 06/18/2018, 10:14 PM

## 2018-06-18 NOTE — Plan of Care (Signed)
Patient just recently admitted to the unit. Patient has not had sufficient time to show progressions at this time. Will continue to monitor for progressions.   Problem: Education: Goal: Knowledge of [place] [facility] Education information/materials will improve Outcome: Not Progressing Goal: Emotional status will improve Outcome: Not Progressing Goal: Mental status will improve Outcome: Not Progressing Goal: Verbalization of understanding the information provided will improve Outcome: Not Progressing   Problem: Activity: Goal: Interest or engagement in activities will improve Outcome: Not Progressing Goal: Sleeping patterns will improve Outcome: Not Progressing   Problem: Coping: Goal: Ability to verbalize frustrations and anger appropriately will improve Outcome: Not Progressing Goal: Ability to demonstrate self-control will improve Outcome: Not Progressing   Problem: Health Behavior/Discharge Planning: Goal: Identification of resources available to assist in meeting health care needs will improve Outcome: Not Progressing Goal: Compliance with treatment plan for underlying cause of condition will improve Outcome: Not Progressing   Problem: Physical Regulation: Goal: Ability to maintain clinical measurements within normal limits will improve Outcome: Not Progressing   Problem: Safety: Goal: Periods of time without injury will increase Outcome: Not Progressing   Problem: Activity: Goal: Interest or engagement in leisure activities will improve Outcome: Not Progressing Goal: Imbalance in normal sleep/wake cycle will improve Outcome: Not Progressing   Problem: Coping: Goal: Coping ability will improve Outcome: Not Progressing Goal: Will verbalize feelings Outcome: Not Progressing   Problem: Health Behavior/Discharge Planning: Goal: Compliance with therapeutic regimen will improve Outcome: Not Progressing   Problem: Medication: Goal: Compliance with prescribed  medication regimen will improve Outcome: Not Progressing   Problem: Self-Concept: Goal: Ability to disclose and discuss suicidal ideas will improve Outcome: Not Progressing

## 2018-06-18 NOTE — Progress Notes (Signed)
D: Received patient from [facility] Emergency Department from [facility] originally. Patient skin assessment completed with Annice Pih, BHT, skin is intact, no contraband found with all unit prohibited items locked and stored away for discharge. Pt. Was admitted under the services of, Dr. Jennet Maduro.   Patient during the admissions process is pleasant and cooperative, but a bit visibly anxious. Pt. Was able to complete the admissions process and sign all needed paperwork. Pt. Endorses a depressed and anxious mood during interviewing, but denies current suicidal ideation and/or thoughts. Pt. Does express prior to admissions the patient had been frequently having thoughts of, "swirving into traffic and just ending it all". Pt. Able to contract for safety on the unit. Pt. Denies pain. Pt. Expresses she has been really lonely living with just her children and working all the time and has been experiencing also financial difficulties. Pt. Expresses that she has been on a medication in the past that she cannot remember, but she did not like it, because, "It sedated me way too much and I have children to take care of". Pt. After interviewing given PRN medications for sleep and anxiety per patient request.    A: Patient oriented to unit/room/call light. Pt. Given extensive admissions education. Patient was encourage to participate in unit activities and continue with plan of care being put into place. Q x 15 minute observation checks were initiated for safety. Pt. Given snack to eat.   R: Patient is receptive to treatment  being put into place and safety to be maintained on unit per MD orders.

## 2018-06-19 DIAGNOSIS — F429 Obsessive-compulsive disorder, unspecified: Secondary | ICD-10-CM | POA: Diagnosis present

## 2018-06-19 DIAGNOSIS — F4310 Post-traumatic stress disorder, unspecified: Secondary | ICD-10-CM | POA: Diagnosis present

## 2018-06-19 DIAGNOSIS — F315 Bipolar disorder, current episode depressed, severe, with psychotic features: Principal | ICD-10-CM

## 2018-06-19 LAB — HCG, QUANTITATIVE, PREGNANCY

## 2018-06-19 MED ORDER — OXCARBAZEPINE 300 MG PO TABS
300.0000 mg | ORAL_TABLET | Freq: Two times a day (BID) | ORAL | Status: DC
  Administered 2018-06-19 – 2018-06-20 (×2): 300 mg via ORAL
  Filled 2018-06-19 (×2): qty 1

## 2018-06-19 MED ORDER — ARIPIPRAZOLE 5 MG PO TABS
5.0000 mg | ORAL_TABLET | Freq: Every day | ORAL | Status: DC
Start: 2018-06-19 — End: 2018-06-20
  Administered 2018-06-19 – 2018-06-20 (×2): 5 mg via ORAL
  Filled 2018-06-19 (×2): qty 1

## 2018-06-19 MED ORDER — FLUVOXAMINE MALEATE 50 MG PO TABS
50.0000 mg | ORAL_TABLET | Freq: Every day | ORAL | Status: DC
  Administered 2018-06-19: 50 mg via ORAL
  Filled 2018-06-19: qty 1

## 2018-06-19 NOTE — Plan of Care (Signed)
Attending unit programing . Compliant  with medication . ,  Patient able to identify anxiety and issues that promote  anxiety .   Improving  with emotional and mental  status No anger outburst . Working on coping issues Voice of  no  suicidal ideas   Problem: Education: Goal: Knowledge of [place] [facility] Education information/materials will improve Outcome: Progressing Goal: Emotional status will improve Outcome: Progressing Goal: Mental status will improve Outcome: Progressing Goal: Verbalization of understanding the information provided will improve Outcome: Progressing   Problem: Activity: Goal: Interest or engagement in activities will improve Outcome: Progressing Goal: Sleeping patterns will improve Outcome: Progressing   Problem: Coping: Goal: Ability to verbalize frustrations and anger appropriately will improve Outcome: Progressing Goal: Ability to demonstrate self-control will improve Outcome: Progressing   Problem: Health Behavior/Discharge Planning: Goal: Identification of resources available to assist in meeting health care needs will improve Outcome: Progressing Goal: Compliance with treatment plan for underlying cause of condition will improve Outcome: Progressing   Problem: Physical Regulation: Goal: Ability to maintain clinical measurements within normal limits will improve Outcome: Progressing   Problem: Safety: Goal: Periods of time without injury will increase Outcome: Progressing   Problem: Activity: Goal: Interest or engagement in leisure activities will improve Outcome: Progressing Goal: Imbalance in normal sleep/wake cycle will improve Outcome: Progressing   Problem: Coping: Goal: Coping ability will improve Outcome: Progressing Goal: Will verbalize feelings Outcome: Progressing   Problem: Health Behavior/Discharge Planning: Goal: Compliance with therapeutic regimen will improve Outcome: Progressing   Problem: Medication: Goal:  Compliance with prescribed medication regimen will improve Outcome: Progressing   Problem: Self-Concept: Goal: Ability to disclose and discuss suicidal ideas will improve Outcome: Progressing

## 2018-06-19 NOTE — Progress Notes (Signed)
Precautionary checks every 15 minutes for safety maintained, room free of safety hazards, patient sustains no injury or falls during this shift. Will endorse care to next shift.   

## 2018-06-19 NOTE — Progress Notes (Addendum)
D: Patient stated slept good last night .Stated appetite poor and energy level  Low . Stated concentration is good . Stated on Depression scale  7, hopeless 7 and anxiety 8 .( low 0-10 high) Denies suicidal  homicidal ideations  .  No auditory hallucinations  No pain concerns . Appropriate ADL'S. Interacting with peers and staff. Attending unit programing . Compliant  with medication . ,  Patient able to identify anxiety and issues that promote  anxiety .   Improving  with emotional and mental  status No anger outburst . Working on coping issues Voice of  no  suicidal ideas  A: Encourage patient participation with unit programming . Instruction  Given on  Medication , verbalize understanding. Instructed p[atient on Breathing Technique and Imagery Therapy  R: Voice no other concerns. Staff continue to monitor

## 2018-06-19 NOTE — Progress Notes (Signed)
Recreation Therapy Notes   Date: 06/19/2018  Time: 9:30 am  Location: Craft Room  Behavioral response: Appropriate  Intervention Topic: Goals  Discussion/Intervention:  Group content on today was focused on goals. Patients described what goals are and how they define goals. Individuals expressed how they go about setting goals and reaching them. The group identified how important goals are and if they make short term goals to reach long term goals. Patients described how many goals they work on at a time and what affects them not reaching their goal. Individuals described how much time they put into planning and obtaining their goals. The group participated in the intervention "My Goal Board" and made personal goal boards to help them achieve their goal. Clinical Observations/Feedback:  Patient came to group and explained that it is easy to lose focus when she does not have any goals. She expressed that when goals are made that are not SMART goals they are just empty promises to yourself. Individual was social with peers and staff while participating in the intervention.  Paula Keller LRT/CTRS         Glyn Gerads 06/19/2018 11:24 AM

## 2018-06-19 NOTE — [facility] Group Notes (Signed)
LCSW Group Therapy Note  06/19/2018 1:00 PM  Type of Therapy/Topic:  Group Therapy:  Feelings about Diagnosis  Participation Level:  Active   Description of Group:   This group will allow patients to explore their thoughts and feelings about diagnoses they have received. Patients will be guided to explore their level of understanding and acceptance of these diagnoses. Facilitator will encourage patients to process their thoughts and feelings about the reactions of others to their diagnosis and will guide patients in identifying ways to discuss their diagnosis with significant others in their lives. This group will be process-oriented, with patients participating in exploration of their own experiences, giving and receiving support, and processing challenge from other group members.   Therapeutic Goals: 1. Patient will demonstrate understanding of diagnosis as evidenced by identifying two or more symptoms of the disorder 2. Patient will be able to express two feelings regarding the diagnosis 3. Patient will demonstrate their ability to communicate their needs through discussion and/or role play  Summary of Patient Progress: Patient was present and active in group.  Patient identified her diagnoses as "Bipolar, Anxiety and PTSD".  Patient reports that she feels "hopeless" with her diagnosis at times in part to a lack of understanding amongst her family and friends.  Patient reports in addition in attempting to educate her family on her diagnosis she "draws and spends time with her kids, even if it a day vacation somewhere".   Therapeutic Modalities:   Cognitive Behavioral Therapy Brief Therapy Feelings Identification   Paula Keller, MSW, LCSW 06/19/2018 2:20 PM

## 2018-06-19 NOTE — Progress Notes (Signed)
Recreation Therapy Notes  INPATIENT RECREATION THERAPY ASSESSMENT  Patient Details Name: Randall Hissmanda Mary Rose Gayheart MRN: [id number] DOB: 02/08/1991 Today's Date: 06/19/2018       Information Obtained From: Patient  Able to Participate in Assessment/Interview: Yes  Patient Presentation: Responsive  Reason for Admission (Per Patient): Active Symptoms, Other (Comments)(Depression and anxiety)  Patient Stressors: Other (Comment)(Financial)  Coping Skills:   Art, Other (Comment)(taking trips, walking)  Leisure Interests (2+):  Art - Paint, Art - Draw(Getting out and doing things)  Frequency of Recreation/Participation:    Awareness of Community Resources:     [employer] Resources:     Current Use:    If no, Barriers?:    Expressed Interest in [employer] CorporationCommunity Resource Information:    [place]  [place]  Patient Main Form of Transportation: Set designerCar  Patient Strengths:  Putting others before myself  Patient Identified Areas of Improvement:  Find myself again, rediscover my confidence  Patient Goal for Hospitalization:  Get out in the community/public  Current SI (including self-harm):  No  Current HI:  No  Current AVH: No  Staff Intervention Plan: Group Attendance, Collaborate with Interdisciplinary Treatment Team  Consent to Intern Participation: N/A  Haeley Fordham 06/19/2018, 11:55 AM

## 2018-06-19 NOTE — H&P (Signed)
Psychiatric Admission Assessment Adult  Patient Identification: Paula Keller MRN:  [id number] Date of Evaluation:  06/19/2018 Chief Complaint:  MDD Principal Diagnosis: Bipolar I disorder, current or most recent episode depressed, with psychotic features (HCC) Diagnosis:  Principal Problem:   Bipolar I disorder, current or most recent episode depressed, with psychotic features (HCC) Active Problems:   Tobacco use disorder   Cannabis use disorder, moderate, dependence (HCC)   HTN (hypertension)   PTSD (post-traumatic stress disorder)   OCD (obsessive compulsive disorder)  History of Present Illness:  Identifying data. Paula Keller is a 28 year old female with a history of bipolar disorder.  Chief complaint. "I am so anxious and restless."  History of present illness. Information was obtained from the patient and the chart. The patient came to the hospital complaining of worsening of depression and anxiety of several weeks duration that was accompanied by visual hallucinations, shadow, auditory non command hallucinations and urges to swirl her car into traffic. The patient was diagnosed with bipolar years ago and experienced many manic and depressive episodes. Never taken any medication. Tried therapy in the past. She reports poor sleep, decreased appetite, anhedonia, feeling of guilt hopelessness worthlessness, poor energy and concentration to the point she was unable to go to work, social isolation, crying spells, heightened anxiety and now suicidal ideation. She reports hallucinations and paranoia. She has incapacitating panic attacks at least 3 times a month, social anxiety, nightmares and flashbacks of PTSD and OCD. She was positive for cannabis. Denies other drugs or alcohol use.  Past psychiatric history. Diagnosed bipolar, no pharmacotherapy, hospitalizations. No suicide attempts. For many years, when manic, she would go out of town to spend couple of months with friends. She  is tempted again but she is a responsible mother now.   Family psychiatric history. Unknown, she is adopted.  Social history. Works as a Associate Professor. Had her first child placed in foster care at the age of 6 months. She has had her back now for two years. No longer CPS involved. Has supportive parents.  Total Time spent with patient: 1 hour  Is the patient at risk to self? Yes.    Has the patient been a risk to self in the past 6 months? No.  Has the patient been a risk to self within the distant past? No.  Is the patient a risk to others? No.  Has the patient been a risk to others in the past 6 months? No.  Has the patient been a risk to others within the distant past? No.   Prior Inpatient Therapy:   Prior Outpatient Therapy:    Alcohol Screening: 1. How often do you have a drink containing alcohol?: Never 2. How many drinks containing alcohol do you have on a typical day when you are drinking?: 1 or 2 3. How often do you have six or more drinks on one occasion?: Never AUDIT-C Score: 0 4. How often during the last year have you found that you were not able to stop drinking once you had started?: Never 5. How often during the last year have you failed to do what was normally expected from you becasue of drinking?: Never 6. How often during the last year have you needed a first drink in the morning to get yourself going after a heavy drinking session?: Never 7. How often during the last year have you had a feeling of guilt of remorse after drinking?: Never 8. How often during the last year have you been  unable to remember what happened the night before because you had been drinking?: Never 9. Have you or someone else been injured as a result of your drinking?: No 10. Has a relative or friend or a doctor or another health worker been concerned about your drinking or suggested you cut down?: No Alcohol Use Disorder Identification Test Final Score (AUDIT): 0 Alcohol Brief  Interventions/Follow-up: AUDIT Score <7 follow-up not indicated Substance Abuse History in the last 12 months:  Yes.   Consequences of Substance Abuse: Negative Previous Psychotropic Medications: No  Psychological Evaluations: No  Past Medical History:  Past Medical History:  Diagnosis Date  . Hypertension     Past Surgical History:  Procedure Laterality Date  . TONSILLECTOMY     Family History:  Family History  Adopted: Yes   Tobacco Screening: Have you used any form of tobacco in the last 30 days? (Cigarettes, Smokeless Tobacco, Cigars, and/or Pipes): Yes Tobacco use, Select all that apply: 5 or more cigarettes per day Are you interested in Tobacco Cessation Medications?: Yes, will notify MD for an order Counseled patient on smoking cessation including recognizing danger situations, developing coping skills and basic information about quitting provided: Yes Social History:  Social History   Substance and Sexual Activity  Alcohol Use No  . Frequency: Never     Social History   Substance and Sexual Activity  Drug Use Yes  . Types: Marijuana    Additional Social History: Marital status: Single Are you sexually active?: No What is your sexual orientation?: Heterosexual Has your sexual activity been affected by drugs, alcohol, medication, or emotional stress?: None reported Does patient have children?: Yes How many children?: 2 How is patient's relationship with their children?: Pt says she is very close with her children says they are the "3 Musketeers'                         Allergies:   Allergies  Allergen Reactions  . Amoxicillin Rash   Lab Results: No results found for this or any previous visit (from the past 48 hour(s)).  Blood Alcohol level:  Lab Results  Component Value Date   ETH <10 06/20/2017    Metabolic Disorder Labs:  No results found for: HGBA1C, MPG No results found for: PROLACTIN No results found for: CHOL, TRIG, HDL, CHOLHDL,  VLDL, LDLCALC  Current Medications: Current Facility-Administered Medications  Medication Dose Route Frequency Provider Last Rate Last Dose  . acetaminophen (TYLENOL) tablet 650 mg  650 mg Oral Q6H PRN Mariel Craft, MD      . alum & mag hydroxide-simeth (MAALOX/MYLANTA) 200-200-20 MG/5ML suspension 30 mL  30 mL Oral Q4H PRN Mariel Craft, MD      . ARIPiprazole (ABILIFY) tablet 5 mg  5 mg Oral Daily ,  B, MD      . fluvoxaMINE (LUVOX) tablet 50 mg  50 mg Oral QHS ,  B, MD      . hydrochlorothiazide (MICROZIDE) capsule 12.5 mg  12.5 mg Oral Daily Mariel Craft, MD   12.5 mg at 06/19/18 0801  . hydrOXYzine (ATARAX/VISTARIL) tablet 25 mg  25 mg Oral Q6H PRN Mariel Craft, MD   25 mg at 06/18/18 2113  . magnesium hydroxide (MILK OF MAGNESIA) suspension 30 mL  30 mL Oral Daily PRN Mariel Craft, MD      . nicotine (NICODERM CQ - dosed in mg/24 hours) patch 14 mg  14 mg Transdermal Daily  Clapacs, Jackquline DenmarkJohn T, MD   14 mg at 06/19/18 0801  . ondansetron (ZOFRAN-ODT) disintegrating tablet 4 mg  4 mg Oral Q6H PRN Mariel CraftMaurer, Sheila M, MD      . Oxcarbazepine (TRILEPTAL) tablet 300 mg  300 mg Oral BID ,  B, MD      . traZODone (DESYREL) tablet 50 mg  50 mg Oral QHS PRN Mariel CraftMaurer, Sheila M, MD   50 mg at 06/18/18 2113   PTA Medications: Medications Prior to Admission  Medication Sig Dispense Refill Last Dose  . albuterol (PROVENTIL HFA;VENTOLIN HFA) 108 (90 Base) MCG/ACT inhaler Inhale 2 puffs into the lungs every 4 (four) hours as needed for wheezing or shortness of breath. 1 Inhaler 0 prn at prn  . hydrochlorothiazide (MICROZIDE) 12.5 MG capsule Take 1 capsule (12.5 mg total) by mouth daily. 15 capsule 0   . ondansetron (ZOFRAN ODT) 4 MG disintegrating tablet Take 1 tablet (4 mg total) by mouth every 6 (six) hours as needed for nausea or vomiting. 20 tablet 0     Musculoskeletal: Strength & Muscle Tone: within normal limits Gait & Station:  normal Patient leans: N/A  Psychiatric Specialty Exam: I reviewed physical exam performed in the ER and agree with the findings. Physical Exam  Nursing note and vitals reviewed. Psychiatric: Her speech is normal. Her mood appears anxious. She is withdrawn and actively hallucinating. Thought content is paranoid. Cognition and memory are normal. She expresses impulsivity. She expresses suicidal ideation. She expresses suicidal plans.    Review of Systems  Neurological: Negative.   Psychiatric/Behavioral: Positive for depression, hallucinations, substance abuse and suicidal ideas. The patient is nervous/anxious and has insomnia.   All other systems reviewed and are negative.   Blood pressure (!) 148/89, pulse 62, temperature 98.4 F (36.9 C), temperature source Oral, resp. rate 18, height 5\' 4"  (1.626 m), weight 108.9 kg, SpO2 100 %.Body mass index is 41.2 kg/m.  See SRA                                                  Sleep:  Number of Hours: 6.45    Treatment Plan Summary: Daily contact with patient to assess and evaluate symptoms and progress in treatment and Medication management   Paula Keller is a 28 year old female with a history of bipolar disorder admitted for worsening depression, hallucinations and suicidal ideation with a plan to swirl her car into traffic.   #Suicidal ideation -patient able to contract for safety in the hospital  #Mood/psychosis -start Trileptal 300 gm BID -start Abilify 5 mg today, increase to 10-20 mg  -Trazodone 50 mg for sleep  #Anxiety, PTSD and OCD -start Luvox 50 mg tonight -consider Minipress  #HTN -HCTZ 12.5 mg daily  #Substance abuse -positive for cannabis -minimizes problems and declines treatment  #Smoking cessation -nicotine patch is available  #Labs -lipid panel, TSH, A1C -EKG -pregnancy test  #Disposition -discharge to home with family -follow up with [facility]   Observation Level/Precautions:  15  minute checks  Laboratory:  CBC Chemistry Profile UDS UA  Psychotherapy:    Medications:    Consultations:    Discharge Concerns:    Estimated LOS:  Other:     Physician Treatment Plan for Primary Diagnosis: Bipolar I disorder, current or most recent episode depressed, with psychotic features (HCC) Long Term Goal(s): Improvement in symptoms so  as ready for discharge  Short Term Goals: Ability to identify changes in lifestyle to reduce recurrence of condition will improve, Ability to verbalize feelings will improve, Ability to disclose and discuss suicidal ideas, Ability to demonstrate self-control will improve, Ability to identify and develop effective coping behaviors will improve, Ability to maintain clinical measurements within normal limits will improve, Compliance with prescribed medications will improve and Ability to identify triggers associated with substance abuse/mental health issues will improve  Physician Treatment Plan for Secondary Diagnosis: Principal Problem:   Bipolar I disorder, current or most recent episode depressed, with psychotic features (HCC) Active Problems:   Tobacco use disorder   Cannabis use disorder, moderate, dependence (HCC)   HTN (hypertension)   PTSD (post-traumatic stress disorder)   OCD (obsessive compulsive disorder)  Long Term Goal(s): Improvement in symptoms so as ready for discharge  Short Term Goals: Ability to identify changes in lifestyle to reduce recurrence of condition will improve, Ability to demonstrate self-control will improve and Ability to identify triggers associated with substance abuse/mental health issues will improve  I certify that inpatient services furnished can reasonably be expected to improve the patient's condition.    Kristine LineaJolanta , MD 2/4/202012:40 PM

## 2018-06-19 NOTE — BHH Suicide Risk Assessment (Signed)
[facility] Admission Suicide Risk Assessment   Nursing information obtained from:  Review of record, Patient Demographic factors:  Caucasian Current Mental Status:  NA(denies) Loss Factors:  Financial problems / change in socioeconomic status Historical Factors:  NA(denies) Risk Reduction Factors:  Responsible for children under 28 years of age, Sense of responsibility to family, Religious beliefs about death, Employed, Positive social support, Positive therapeutic relationship, Positive coping skills or problem solving skills  Total Time spent with patient: 1 hour Principal Problem: Bipolar I disorder, current or most recent episode depressed, with psychotic features (HCC) Diagnosis:  Principal Problem:   Bipolar I disorder, current or most recent episode depressed, with psychotic features (HCC) Active Problems:   Tobacco use disorder   Cannabis use disorder, moderate, dependence (HCC)   HTN (hypertension)   PTSD (post-traumatic stress disorder)   OCD (obsessive compulsive disorder)  Subjective Data: suicidal ideation  Continued Clinical Symptoms:  Alcohol Use Disorder Identification Test Final Score (AUDIT): 0 The "Alcohol Use Disorders Identification Test", Guidelines for Use in Primary Care, Second Edition.  World Science writer [facility] [place]). Score between 0-7:  no or low risk or alcohol related problems. Score between 8-15:  moderate risk of alcohol related problems. Score between 16-19:  high risk of alcohol related problems. Score 20 or above:  warrants further diagnostic evaluation for alcohol dependence and treatment.   CLINICAL FACTORS:   Depression:   Comorbid alcohol abuse/dependence Impulsivity Insomnia Alcohol/Substance Abuse/Dependencies   Musculoskeletal: Strength & Muscle Tone: within normal limits Gait & Station: normal Patient leans: N/A  Psychiatric Specialty Exam: Physical Exam  Nursing note and vitals reviewed. Psychiatric: Her speech is normal. She is  slowed and withdrawn. Cognition and memory are normal. She expresses impulsivity. She exhibits a depressed mood. She expresses suicidal ideation. She expresses suicidal plans.    Review of Systems  Neurological: Negative.   Psychiatric/Behavioral: Positive for depression, substance abuse and suicidal ideas. The patient has insomnia.   All other systems reviewed and are negative.   Blood pressure (!) 148/89, pulse 62, temperature 98.4 F (36.9 C), temperature source Oral, resp. rate 18, height 5\' 4"  (1.626 m), weight 108.9 kg, SpO2 100 %.Body mass index is 41.2 kg/m.  General Appearance: Casual  Eye Contact:  Good  Speech:  Clear and Coherent  Volume:  Decreased  Mood:  Depressed, Hopeless and Worthless  Affect:  Blunt  Thought Process:  Goal Directed and Descriptions of Associations: Intact  Orientation:  Full (Time, Place, and Person)  Thought Content:  Hallucinations: Auditory Visual  Suicidal Thoughts:  Yes.  with intent/plan  Homicidal Thoughts:  No  Memory:  Immediate;   Fair Recent;   Fair Remote;   Fair  Judgement:  Impaired  Insight:  Shallow  Psychomotor Activity:  Decreased  Concentration:  Concentration: Fair and Attention Span: Fair  Recall:  [employer] of Knowledge:  Fair  Language:  Fair  Akathisia:  No  Handed:  Right  AIMS (if indicated):     Assets:  Communication Skills Desire for Improvement Financial Resources/Insurance Housing Physical Health Resilience Social Support  ADL's:  Intact  Cognition:  WNL  Sleep:  Number of Hours: 6.45      COGNITIVE FEATURES THAT CONTRIBUTE TO RISK:  None    SUICIDE RISK:   Moderate:  Frequent suicidal ideation with limited intensity, and duration, some specificity in terms of plans, no associated intent, good self-control, limited dysphoria/symptomatology, some risk factors present, and identifiable protective factors, including available and accessible social support.  PLAN OF CARE: hospital admission,  medication management, discharge planning.  Paula Keller is a 28 year old female with a history of bipolar disorder admitted for worsening depression, hallucinations and suicidal ideation with a plan to swirl her car into traffic.   #Suicidal ideation -patient able to contract for safety in the hospital  #Mood/psychosis -start Trileptal 300 gm BID -start Abilify 5 mg today, increase to 10-20 mg  -Trazodone 50 mg for sleep  #Anxiety, PTSD and OCD -start Luvox 50 mg tonight -consider Minipress  #HTN -HCTZ 12.5 mg daily  #Smoking cessation -nicotine patch is available  #Labs -lipid panel, TSH, A1C -EKG -pregnancy test  #Disposition -discharge to home with family -follow up with [facility]    I certify that inpatient services furnished can reasonably be expected to improve the patient's condition.   Kristine LineaJolanta Remonia Otte, MD 06/19/2018, 12:29 PM
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
## 2018-06-20 LAB — TSH: TSH: 1.155 u[IU]/mL (ref 0.350–4.500)

## 2018-06-20 LAB — LIPID PANEL
CHOL/HDL RATIO: 3.8 ratio
Cholesterol: 178 mg/dL (ref 0–200)
HDL: 47 mg/dL (ref >40)
LDL Cholesterol: 106 mg/dL — ABNORMAL HIGH (ref 0–99)
Triglycerides: 125 mg/dL (ref <150)
VLDL: 25 mg/dL (ref 0–40)

## 2018-06-20 LAB — HEMOGLOBIN A1C
HEMOGLOBIN A1C: 5.8 % — AB (ref 4.8–5.6)
Mean Plasma Glucose: 119.76 mg/dL

## 2018-06-20 MED ORDER — ARIPIPRAZOLE 10 MG PO TABS: 10.0000 mg | ORAL_TABLET | Freq: Every day | ORAL | 1 refills | Status: DC

## 2018-06-20 MED ORDER — HYDROCHLOROTHIAZIDE 12.5 MG PO CAPS
12.5000 mg | ORAL_CAPSULE | Freq: Every day | ORAL | 0 refills | Status: DC
Start: 2018-06-20 — End: 2018-10-14

## 2018-06-20 MED ORDER — FLUVOXAMINE MALEATE 50 MG PO TABS: 100.0000 mg | ORAL_TABLET | Freq: Every day | ORAL | Status: DC

## 2018-06-20 MED ORDER — ARIPIPRAZOLE 10 MG PO TABS: 10.0000 mg | ORAL_TABLET | Freq: Every day | ORAL | Status: DC

## 2018-06-20 MED ORDER — TRAZODONE HCL 50 MG PO TABS: 50.0000 mg | ORAL_TABLET | Freq: Every evening | ORAL | 1 refills | Status: DC | PRN

## 2018-06-20 MED ORDER — OXCARBAZEPINE 300 MG PO TABS: 300.0000 mg | ORAL_TABLET | Freq: Two times a day (BID) | ORAL | 1 refills | Status: DC

## 2018-06-20 MED ORDER — HYDROXYZINE HCL 25 MG PO TABS: 25.0000 mg | ORAL_TABLET | Freq: Four times a day (QID) | ORAL | 1 refills | Status: DC | PRN

## 2018-06-20 MED ORDER — FLUVOXAMINE MALEATE 100 MG PO TABS: 100.0000 mg | ORAL_TABLET | Freq: Every day | ORAL | 1 refills | Status: DC

## 2018-06-20 NOTE — Progress Notes (Signed)
Recreation Therapy Notes   Date: 06/20/2018  Time: 9:30 am  Location: Craft Room  Behavioral response: Appropriate  Intervention Topic: Self-Care  Discussion/Intervention:  Group content today was focused on Self-Care. The group defined self-care and some positive ways they care for themselves. Individuals expressed ways and reasons why they neglected any self-care in the past. Patients described ways to improve self-care in the future. The group explained what could happen if they did not do any self-care activities at all. The group participated in the intervention "self-care assessment" where they had a chance to discover some of their weaknesses and strengths in self- care. Patient came up with a self-care plan to improve themselves in the future.  Clinical Observations/Feedback:  Patient came to group and defined self-care as taking time for self. She identified hygiene and a positive environment as components of self-care. Participant expressed that she tries to make time for herself but get busy a lot. Individual was social with peers and staff while participating in the intervention.  Luiz Trumpower LRT/CTRS          Dameka Younker 06/20/2018 12:00 PM

## 2018-06-20 NOTE — [facility] Suicide Risk Assessment (Signed)
[facility] INPATIENT:  Family/Significant Other Suicide Prevention Education  Suicide Prevention Education:  Education Completed; Paula Keller, mother [phone] has been identified by the patient as the family member/significant other with whom the patient will be residing, and identified as the person(s) who will aid the patient in the event of a mental health crisis (suicidal ideations/suicide attempt).  With written consent from the patient, the family member/significant other has been provided the following suicide prevention education, prior to the and/or following the discharge of the patient.  The suicide prevention education provided includes the following:  Suicide risk factors  Suicide prevention and interventions  National Suicide Hotline telephone number  [facility] assessment telephone number  [facility] Emergency Assistance 911  [facility] and/or Residential Mobile Crisis Unit telephone number  Request made of family/significant other to:  Remove weapons (e.g., guns, rifles, knives), all items previously/currently identified as safety concern.    Remove drugs/medications (over-the-counter, prescriptions, illicit drugs), all items previously/currently identified as a safety concern.  The family member/significant other verbalizes understanding of the suicide prevention education information provided.  The family member/significant other agrees to remove the items of safety concern listed above. Paula Keller denies pt having access to guns or weapons in the home and has no reservation about the pt returning to the home. She says she spoke with the pt and she stated she is committed to receiving opt and med mgmt when she is discharged from hospital. Paula Keller states her and her husband have been providing care for pt's children since she has been hospitalized. She states they will have a family meeting when pt comes home to decide if pt is strong enough to  care for the children; if not she says they are willing to continue caring for the children until pt is better.  Paula Keller T Joel Mericle 06/20/2018, 9:56 AM

## 2018-06-20 NOTE — [facility] Suicide Risk Assessment (Signed)
[facility] INPATIENT:  Family/Significant Other Suicide Prevention Education  Suicide Prevention Education:  Contact Attempts: Dorene Grebe Adames [phone] mother has been identified by the patient as the family member/significant other with whom the patient will be residing, and identified as the person(s) who will aid the patient in the event of a mental health crisis.  With written consent from the patient, two attempts were made to provide suicide prevention education, prior to and/or following the patient's discharge.  We were unsuccessful in providing suicide prevention education.  A suicide education pamphlet was given to the patient to share with family/significant other.  Date and time of first attempt: 06/20/18 936am; left VM Date and time of second attempt:  Suzan Slick 06/20/2018, 9:36 AM

## 2018-06-20 NOTE — Plan of Care (Signed)
Patient is alert and oriented x 4 stable and responding well to assessments , patient states doing well with treatment and medications is working well for her with out any noticeable side effects, compliant with her medications .   Pleasant upon approach seen patient in the milieu area socializing with peers and participating in group activities with out any issues, mood is bright and affect congruent with mood . Patient rates her depression and anxiety at 3/10  Patient is encouraged to continue therapeutic  treatment as planned , patient denies any SI/HI /AVH patient voice no concerns.  Problem: Education: Goal: Knowledge of  General Education information/materials will improve Outcome: Progressing Goal: Emotional status will improve Outcome: Progressing Goal: Mental status will improve Outcome: Progressing Goal: Verbalization of understanding the information provided will improve Outcome: Progressing   Problem: Activity: Goal: Interest or engagement in activities will improve Outcome: Progressing Goal: Sleeping patterns will improve Outcome: Progressing   Problem: Coping: Goal: Ability to verbalize frustrations and anger appropriately will improve Outcome: Progressing Goal: Ability to demonstrate self-control will improve Outcome: Progressing   Problem: Health Behavior/Discharge Planning: Goal: Identification of resources available to assist in meeting health care needs will improve Outcome: Progressing Goal: Compliance with treatment plan for underlying cause of condition will improve Outcome: Progressing   Problem: Physical Regulation: Goal: Ability to maintain clinical measurements within normal limits will improve Outcome: Progressing   Problem: Safety: Goal: Periods of time without injury will increase Outcome: Progressing   Problem: Activity: Goal: Interest or engagement in leisure activities will improve Outcome: Progressing Goal: Imbalance in normal  sleep/wake cycle will improve Outcome: Progressing   Problem: Coping: Goal: Coping ability will improve Outcome: Progressing Goal: Will verbalize feelings Outcome: Progressing   Problem: Health Behavior/Discharge Planning: Goal: Compliance with therapeutic regimen will improve Outcome: Progressing   Problem: Medication: Goal: Compliance with prescribed medication regimen will improve Outcome: Progressing   Problem: Self-Concept: Goal: Ability to disclose and discuss suicidal ideas will improve Outcome: Progressing   sleep is continuous without any interruptions., appetite is good, patient is monitored  every 15 minutes for safety no distress noted.

## 2018-06-20 NOTE — Progress Notes (Signed)
D- Patient alert and oriented. Patient presents in a pleasant mood on assessment stating that she slept ok last night but had some complaints of her "stomach and back hurting", rating her pain level a "5/10", because she has not had a bowel movement since "Saturday". Patient requested MOM from this writer. Patient denied anxiety, however, she rated her depression a "4/10" stating that "I just miss my kids". Patient also denies SI, HI, AVH, at this time. Patient's goal for today is to "figure out what to do next".  A- Scheduled medications administered to patient, per MD orders. Support and encouragement provided.  Routine safety checks conducted every 15 minutes.  Patient informed to notify staff with problems or concerns.  R- No adverse drug reactions noted. Patient contracts for safety at this time. Patient compliant with medications and treatment plan. Patient receptive, calm, and cooperative. Patient interacts well with others on the unit.  Patient remains safe at this time.

## 2018-06-20 NOTE — [facility] Group Notes (Signed)
LCSW Group Therapy Note  06/20/2018 2:14 PM  Type of Therapy/Topic:  Group Therapy:  Emotion Regulation  Participation Level:  Active   Description of Group:   The purpose of this group is to assist patients in learning to regulate negative emotions and experience positive emotions. Patients will be guided to discuss ways in which they have been vulnerable to their negative emotions. These vulnerabilities will be juxtaposed with experiences of positive emotions or situations, and patients will be challenged to use positive emotions to combat negative ones. Special emphasis will be placed on coping with negative emotions in conflict situations, and patients will process healthy conflict resolution skills.  Therapeutic Goals: 1. Patient will identify two positive emotions or experiences to reflect on in order to balance out negative emotions 2. Patient will label two or more emotions that they find the most difficult to experience 3. Patient will demonstrate positive conflict resolution skills through discussion and/or role plays  Summary of Patient Progress: Pt was present and appropriate in group. Pt reported that she is feeling a little fearful about discharging and getting back into the routine of her daily life outside of the hospital. Pt discussed cleaning as a way to combat her emotions and help regulate them.   Therapeutic Modalities:   Cognitive Behavioral Therapy Feelings Identification Dialectical Behavioral Therapy   Iris Pert, MSW, LCSW Clinical Social Work 06/20/2018 2:14 PM

## 2018-06-20 NOTE — [facility] Group Notes (Signed)
[facility] Group Notes:  (Nursing/MHT/Case Management/Adjunct)  Date:  06/20/2018  Time:  2:57 PM  Type of Therapy:  Psychoeducational Skills  Participation Level:  Active  Participation Quality:  Appropriate, Attentive, Sharing and Supportive  Affect:  Appropriate and Excited  Cognitive:  Alert, Appropriate and Oriented  Insight:  Appropriate, Good and Improving  Engagement in Group:  Developing/Improving, Engaged, Improving and Supportive  Modes of Intervention:  Education  Summary of Progress/Problems:  Paula Keller  Paula Keller 06/20/2018, 2:57 PM

## 2018-06-20 NOTE — [facility] Suicide Risk Assessment (Signed)
[facility]   Principal Problem: Bipolar I disorder, current or most recent episode depressed, with psychotic features [facility](HCC) Discharge Diagnoses: Principal Problem:   Bipolar I disorder, current or most recent episode depressed, with psychotic features (HCC) Active Problems:   Tobacco use disorder   Cannabis use disorder, moderate, dependence (HCC)   HTN (hypertension)   PTSD (post-traumatic stress disorder)   OCD (obsessive compulsive disorder)   Total Time spent with patient: 20 minutes  Musculoskeletal: Strength & Muscle Tone: within normal limits Gait & Station: normal Patient leans: N/A  Psychiatric Specialty Exam: Review of Systems  Neurological: Negative.   Psychiatric/Behavioral: Negative.   All other systems reviewed and are negative.   Blood pressure (!) 157/103, pulse (!) 56, temperature 98.2 F (36.8 C), temperature source Oral, resp. rate 18, height 5\' 4"  (1.626 m), weight 108.9 kg, SpO2 98 %.Body mass index is 41.2 kg/m.  General Appearance: Casual  Eye Contact::  Good  Speech:  Clear and Coherent409  Volume:  Normal  Mood:  Euthymic  Affect:  Appropriate  Thought Process:  Goal Directed and Descriptions of Associations: Intact  Orientation:  Full (Time, Place, and Person)  Thought Content:  WDL  Suicidal Thoughts:  No  Homicidal Thoughts:  No  Memory:  Immediate;   Fair Recent;   Fair Remote;   Fair  Judgement:  Fair  Insight:  Present  Psychomotor Activity:  Normal  Concentration:  Fair  Recall:  [employer]Fair  Fund of Knowledge:Fair  Language: Fair  Akathisia:  No  Handed:  Right  AIMS (if indicated):     Assets:  Communication Skills Desire for Improvement Housing Physical Health Resilience Social Support Transportation Vocational/Educational  Sleep:  Number of Hours: 7  Cognition: WNL  ADL's:  Intact   Mental Status Per Nursing Assessment::   On Admission:  NA(denies)  Demographic Factors:  Divorced or widowed,  Caucasian and Low socioeconomic status  Loss Factors: Financial problems/change in socioeconomic status  Historical Factors: Impulsivity  Risk Reduction Factors:   Responsible for children under 28 years of age, Sense of responsibility to family, Employed, Living with another person, especially a relative and Positive social support  Continued Clinical Symptoms:  Bipolar Disorder:   Depressive phase Alcohol/Substance Abuse/Dependencies  Cognitive Features That Contribute To Risk:  None    Suicide Risk:  Minimal: No identifiable suicidal ideation.  Patients presenting with no risk factors but with morbid ruminations; may be classified as minimal risk based on the severity of the depressive symptoms  Follow-up Information    [facility] Follow up.   Contact information: [address] [place] [address] [phone]           Plan Of Care/Follow-up recommendations:  Activity:  as tolerated Diet:  low sodium heart healthy Other:  keep follow up appointments  Kristine LineaJolanta Jakelyn Squyres, MD 06/20/2018, 9:17 AM

## 2018-06-20 NOTE — Progress Notes (Signed)
  [facility] Adult Case Management Discharge Plan :  Will you be returning to the same living situation after discharge:  Yes,  pt lives with her children At discharge, do you have transportation home?: Yes,  pt's father will provide transportation Do you have the ability to pay for your medications: No.  Release of information consent forms completed and in the chart;  Patient's signature needed at discharge.  Patient to Follow up at: Follow-up Information    [facility] Go on 06/26/2018.   Why:  Please follow up at [facility] on Tuesday, June 26, 2018 at 830am. Please bring photo ID, insurance card, medication. Thank you. Contact information: [address] [place] [address] [phone]           Next level of care provider has access to [facility] Link:no  Safety Planning and Suicide Prevention discussed: Yes,  Natalie Severt, mother  Have you used any form of tobacco in the last 30 days? (Cigarettes, Smokeless Tobacco, Cigars, and/or Pipes): Yes  Has patient been referred to the Quitline?: Physician counseled pt on smoking cessation  Patient has been referred for addiction treatment: N/A  Suzan Slick, LCSW 06/20/2018, 10:01 AM

## 2018-06-20 NOTE — Progress Notes (Signed)
Recreation Therapy Notes  INPATIENT RECREATION TR PLAN  Patient Details Name: Paula Keller MRN: [id number] DOB: 1990/11/28 Today's Date: 06/20/2018  Rec Therapy Plan Is patient appropriate for Therapeutic Recreation?: Yes Treatment times per week: at least 3 Estimated Length of Stay: 5-7 days TR Treatment/Interventions: Group participation (Comment)  Discharge Criteria Pt will be discharged from therapy if:: Discharged Treatment plan/goals/alternatives discussed and agreed upon by:: Patient/family  Discharge Summary Short term goals set: Patient will identify 3 positive traits/characteristic about themselves within 5 recreation therapy group sessions Short term goals met: Adequate for discharge Progress toward goals comments: Groups attended Which groups?: Goal setting, Other (Comment)(Self-care) Reason goals not met: N/A Therapeutic equipment acquired: N/A Reason patient discharged from therapy: Discharge from hospital Pt/family agrees with progress & goals achieved: Yes Date patient discharged from therapy: 06/20/18   Nickolis Diel 06/20/2018, 12:15 PM

## 2018-06-20 NOTE — Tx Team (Addendum)
Interdisciplinary Treatment and Diagnostic Plan Update  06/20/2018 Time of Session: 1030am Paula Keller MRN: [id number]  Principal Diagnosis: Bipolar I disorder, current or most recent episode depressed, with psychotic features (HCC)  Secondary Diagnoses: Principal Problem:   Bipolar I disorder, current or most recent episode depressed, with psychotic features (HCC) Active Problems:   Tobacco use disorder   Cannabis use disorder, moderate, dependence (HCC)   HTN (hypertension)   PTSD (post-traumatic stress disorder)   OCD (obsessive compulsive disorder)   Current Medications:  Current Facility-Administered Medications  Medication Dose Route Frequency Provider Last Rate Last Dose  . acetaminophen (TYLENOL) tablet 650 mg  650 mg Oral Q6H PRN Mariel Craft, MD      . alum & mag hydroxide-simeth (MAALOX/MYLANTA) 200-200-20 MG/5ML suspension 30 mL  30 mL Oral Q4H PRN Mariel Craft, MD      . Melene Muller ON 06/21/2018] ARIPiprazole (ABILIFY) tablet 10 mg  10 mg Oral Daily Pucilowska, Jolanta B, MD      . fluvoxaMINE (LUVOX) tablet 100 mg  100 mg Oral QHS Pucilowska, Jolanta B, MD      . hydrochlorothiazide (MICROZIDE) capsule 12.5 mg  12.5 mg Oral Daily Mariel Craft, MD   12.5 mg at 06/20/18 0813  . hydrOXYzine (ATARAX/VISTARIL) tablet 25 mg  25 mg Oral Q6H PRN Mariel Craft, MD   25 mg at 06/18/18 2113  . magnesium hydroxide (MILK OF MAGNESIA) suspension 30 mL  30 mL Oral Daily PRN Mariel Craft, MD   30 mL at 06/20/18 4290  . nicotine (NICODERM CQ - dosed in mg/24 hours) patch 14 mg  14 mg Transdermal Daily Clapacs, Jackquline Denmark, MD   14 mg at 06/20/18 3795  . Oxcarbazepine (TRILEPTAL) tablet 300 mg  300 mg Oral BID Pucilowska, Jolanta B, MD   300 mg at 06/20/18 0813  . traZODone (DESYREL) tablet 50 mg  50 mg Oral QHS PRN Mariel Craft, MD   50 mg at 06/19/18 2134   PTA Medications: Medications Prior to Admission  Medication Sig Dispense Refill Last Dose  . albuterol  (PROVENTIL HFA;VENTOLIN HFA) 108 (90 Base) MCG/ACT inhaler Inhale 2 puffs into the lungs every 4 (four) hours as needed for wheezing or shortness of breath. 1 Inhaler 0 prn at prn  . ondansetron (ZOFRAN ODT) 4 MG disintegrating tablet Take 1 tablet (4 mg total) by mouth every 6 (six) hours as needed for nausea or vomiting. 20 tablet 0   . [DISCONTINUED] hydrochlorothiazide (MICROZIDE) 12.5 MG capsule Take 1 capsule (12.5 mg total) by mouth daily. 15 capsule 0     Patient Stressors: Financial difficulties Marital or family conflict Medication change or noncompliance Other: suicidal thoughts, depression, anxiety   Patient Strengths: Ability for insight Active sense of humor Average or above average intelligence Capable of independent living Communication skills General fund of knowledge Motivation for treatment/growth Physical Health Special hobby/interest Supportive family/friends Work skills  Treatment Modalities: Medication Management, Group therapy, Case management,  1 to 1 session with clinician, Psychoeducation, Recreational therapy.   Physician Treatment Plan for Primary Diagnosis: Bipolar I disorder, current or most recent episode depressed, with psychotic features (HCC) Long Term Goal(s): Improvement in symptoms so as ready for discharge Improvement in symptoms so as ready for discharge   Short Term Goals: Ability to identify changes in lifestyle to reduce recurrence of condition will improve Ability to verbalize feelings will improve Ability to disclose and discuss suicidal ideas Ability to demonstrate self-control will improve Ability  to identify and develop effective coping behaviors will improve Ability to maintain clinical measurements within normal limits will improve Compliance with prescribed medications will improve Ability to identify triggers associated with substance abuse/mental health issues will improve Ability to identify changes in lifestyle to reduce  recurrence of condition will improve Ability to demonstrate self-control will improve Ability to identify triggers associated with substance abuse/mental health issues will improve  Medication Management: Evaluate patient's response, side effects, and tolerance of medication regimen.  Therapeutic Interventions: 1 to 1 sessions, Unit Group sessions and Medication administration.  Evaluation of Outcomes: Progressing  Physician Treatment Plan for Secondary Diagnosis: Principal Problem:   Bipolar I disorder, current or most recent episode depressed, with psychotic features (HCC) Active Problems:   Tobacco use disorder   Cannabis use disorder, moderate, dependence (HCC)   HTN (hypertension)   PTSD (post-traumatic stress disorder)   OCD (obsessive compulsive disorder)  Long Term Goal(s): Improvement in symptoms so as ready for discharge Improvement in symptoms so as ready for discharge   Short Term Goals: Ability to identify changes in lifestyle to reduce recurrence of condition will improve Ability to verbalize feelings will improve Ability to disclose and discuss suicidal ideas Ability to demonstrate self-control will improve Ability to identify and develop effective coping behaviors will improve Ability to maintain clinical measurements within normal limits will improve Compliance with prescribed medications will improve Ability to identify triggers associated with substance abuse/mental health issues will improve Ability to identify changes in lifestyle to reduce recurrence of condition will improve Ability to demonstrate self-control will improve Ability to identify triggers associated with substance abuse/mental health issues will improve     Medication Management: Evaluate patient's response, side effects, and tolerance of medication regimen.  Therapeutic Interventions: 1 to 1 sessions, Unit Group sessions and Medication administration.  Evaluation of Outcomes:  Progressing   RN Treatment Plan for Primary Diagnosis: Bipolar I disorder, current or most recent episode depressed, with psychotic features (HCC) Long Term Goal(s): Knowledge of disease and therapeutic regimen to maintain health will improve  Short Term Goals: Ability to verbalize feelings will improve, Ability to disclose and discuss suicidal ideas, Ability to identify and develop effective coping behaviors will improve and Compliance with prescribed medications will improve  Medication Management: RN will administer medications as ordered by provider, will assess and evaluate patient's response and provide education to patient for prescribed medication. RN will report any adverse and/or side effects to prescribing provider.  Therapeutic Interventions: 1 on 1 counseling sessions, Psychoeducation, Medication administration, Evaluate responses to treatment, Monitor vital signs and CBGs as ordered, Perform/monitor CIWA, COWS, AIMS and Fall Risk screenings as ordered, Perform wound care treatments as ordered.  Evaluation of Outcomes: Progressing   LCSW Treatment Plan for Primary Diagnosis: Bipolar I disorder, current or most recent episode depressed, with psychotic features (HCC) Long Term Goal(s): Safe transition to appropriate next level of care at discharge, Engage patient in therapeutic group addressing interpersonal concerns.  Short Term Goals: Engage patient in aftercare planning with referrals and resources  Therapeutic Interventions: Assess for all discharge needs, 1 to 1 time with Social worker, Explore available resources and support systems, Assess for adequacy in community support network, Educate family and significant other(s) on suicide prevention, Complete Psychosocial Assessment, Interpersonal group therapy.  Evaluation of Outcomes: Progressing   Progress in Treatment: Attending groups: Yes. Participating in groups: Yes. Taking medication as prescribed: Yes. Toleration  medication: Yes. Family/Significant other contact made: Yes, individual(s) contacted:  Velna Hatchet, mother Patient understands  diagnosis: Yes. Discussing patient identified problems/goals with staff: Yes. Medical problems stabilized or resolved: Yes. Denies suicidal/homicidal ideation: Yes. Issues/concerns per patient self-inventory: No. Other: NA  New problem(s) identified: No, Describe:  none reported  New Short Term/Long Term Goal(s): "To control myself and find coping skills to handle my children better"  Patient Goals:  "To control myself and find coping skills to handle my children better"  Discharge Plan or Barriers: Pt will return home and [place] [facility] [place]  Reason for Continuation of Hospitalization: Medication stabilization  Estimated Length of Stay: D/C 06/20/2018  Recreational Therapy: Patient Stressors: Financial  Patient Goal: Patient will identify 3 positive traits/characteristic about themselves within 5 recreation therapy group sessions  Attendees: Patient: Paula Keller 06/20/2018 2:27 PM  Physician: Kristine LineaJolanta Pucilowska MD 06/20/2018 2:27 PM  Nursing: Milas HockShatara Powell RN 06/20/2018 2:27 PM  RN Care Manager: 06/20/2018 2:27 PM  Social Worker: Darren Leatha GildingLivingston LCSW, NicholsMichaela Stanfield LCSW, Olivia Moton LCSW 06/20/2018 2:27 PM  Recreational Therapist: Garret ReddishShay Jermar Colter LRT 06/20/2018 2:27 PM  Other:  06/20/2018 2:27 PM  Other:  06/20/2018 2:27 PM  Other: 06/20/2018 2:27 PM    Scribe for Treatment Team: Suzan SlickARREN T LIVINGSTON, LCSW 06/20/2018 2:27 PM

## 2018-06-20 NOTE — Progress Notes (Signed)
Patient ID: Randall HissAmanda Mary Rose Keller, female   DOB: 10/03/1990, 28 y.o.   MRN: [id number]   Discharge Note:  Patient denies SI/HI/AVH at this time. Discharge instructions, AVS, prescriptions, and transition record gone over with patient. Patient belongings returned to patient. Patient agrees to comply with medication management, follow-up visit, and outpatient therapy. Patient questions and concerns addressed and answered. Patient ambulatory off unit. Patient discharged to home with her father.

## 2018-06-20 NOTE — Discharge Summary (Addendum)
Physician Discharge Summary Note  Patient:  Paula Keller is an 28 y.o., female MRN:  [id number] DOB:  [birth year] Patient phone:  [phone] (home)  Patient address:   [address] [place] [address],  Total Time spent with patient: 20 minutes plus 15 min on care coordination and documentation  Date of Admission:  06/18/2018 Date of Discharge: 06/20/2018  Reason for Admission:  Suicidal ideation.  History of Present Illness:  Identifying data. Ms. Buning is a 28 year old female with a history of bipolar disorder.  Chief complaint. "I am so anxious and restless."  History of present illness. Information was obtained from the patient and the chart. The patient came to the hospital complaining of worsening of depression and anxiety of several weeks duration that was accompanied by visual hallucinations, shadow, auditory non command hallucinations and urges to swirl her car into traffic. The patient was diagnosed with bipolar years ago and experienced many manic and depressive episodes. Never taken any medication. Tried therapy in the past. She reports poor sleep, decreased appetite, anhedonia, feeling of guilt hopelessness worthlessness, poor energy and concentration to the point she was unable to go to work, social isolation, crying spells, heightened anxiety and now suicidal ideation. She reports hallucinations and paranoia. She has incapacitating panic attacks at least 3 times a month, social anxiety, nightmares and flashbacks of PTSD and OCD. She was positive for cannabis. Denies other drugs or alcohol use.  Past psychiatric history. Diagnosed bipolar, no pharmacotherapy, hospitalizations. No suicide attempts. For many years, when manic, she would go out of town to spend couple of months with friends. She is tempted again but she is a responsible mother now.   Family psychiatric history. Unknown, she is adopted.  Social history. Works as a Associate Professor. Had her first  child placed in foster care at the age of 6 months. She has had her back now for two years. No longer CPS involved. Has supportive parents.   Principal Problem: Bipolar I disorder, current or most recent episode depressed, with psychotic features [facility]) Discharge Diagnoses: Principal Problem:   Bipolar I disorder, current or most recent episode depressed, with psychotic features (HCC) Active Problems:   Tobacco use disorder   Cannabis use disorder, moderate, dependence (HCC)   HTN (hypertension)   PTSD (post-traumatic stress disorder)   OCD (obsessive compulsive disorder)   Past Medical History:  Past Medical History:  Diagnosis Date  . Hypertension     Past Surgical History:  Procedure Laterality Date  . TONSILLECTOMY     Family History:  Family History  Adopted: Yes   Social History:  Social History   Substance and Sexual Activity  Alcohol Use No  . Frequency: Never     Social History   Substance and Sexual Activity  Drug Use Yes  . Types: Marijuana    Social History   Socioeconomic History  . Marital status: Single    Spouse name: Not on file  . Number of children: Not on file  . Years of education: Not on file  . Highest education level: Not on file  Occupational History  . Not on file  Social Needs  . Financial resource strain: Not on file  . Food insecurity:    Worry: Not on file    Inability: Not on file  . Transportation needs:    Medical: Not on file    Non-medical: Not on file  Tobacco Use  . Smoking status: Current Every Day Smoker    Packs/day:  0.50    Types: Cigarettes  . Smokeless tobacco: Never Used  Substance and Sexual Activity  . Alcohol use: No    Frequency: Never  . Drug use: Yes    Types: Marijuana  . Sexual activity: Yes    Birth control/protection: None  Lifestyle  . Physical activity:    Days per week: Not on file    Minutes per session: Not on file  . Stress: Not on file  Relationships  . Social connections:     Talks on phone: Not on file    Gets together: Not on file    Attends religious service: Not on file    Active member of club or organization: Not on file    Attends meetings of clubs or organizations: Not on file    Relationship status: Not on file  Other Topics Concern  . Not on file  Social History Narrative  . Not on file    Hospital Course:    Ms. Alwood is a 28 year old female with a history of untreated bipolar disorder admitted for worsening depression, hallucinations and suicidal ideation with a plan to swirl her car into traffic. She accepted medications and tolerated them well. At the time of discharge, she is no longer suicidal, homicidal or psychotic. She is able to contract for safety. She is forward thinking and optimistic about the future.  #Mood/psychosis, improved -continue Trileptal 300 gm BID -Abilify 10 mg daily   -Trazodone 50 mg for sleep  #Anxiety, PTSD and OCD -continue Luvox 100 mg nightly   #HTN -HCTZ 12.5 mg daily  #Substance abuse -positive for cannabis -minimizes problems and declines treatment  #Smoking cessation -nicotine patch is available  #Labs -lipid panel, TSH are normal, A1C 5.8 -EKG -pregnancy test negative  #Disposition -discharge to home with family -follow up with [facility]  Physical Findings: AIMS:  , ,  ,  ,    CIWA:    COWS:     Musculoskeletal: Strength & Muscle Tone: within normal limits Gait & Station: normal Patient leans: N/A  Psychiatric Specialty Exam: Physical Exam  Nursing note and vitals reviewed. Psychiatric: She has a normal mood and affect. Her speech is normal. Thought content normal. Cognition and memory are normal. She expresses impulsivity.    Review of Systems  Neurological: Negative.   Psychiatric/Behavioral: Negative.   All other systems reviewed and are negative.   Blood pressure (!) 157/103, pulse (!) 56, temperature 98.2 F (36.8 C), temperature source Oral, resp. rate 18, height  5\' 4"  (1.626 m), weight 108.9 kg, SpO2 98 %.Body mass index is 41.2 kg/m.  General Appearance: Casual  Eye Contact:  Good  Speech:  Clear and Coherent  Volume:  Normal  Mood:  Euthymic  Affect:  Appropriate  Thought Process:  Goal Directed and Descriptions of Associations: Intact  Orientation:  Full (Time, Place, and Person)  Thought Content:  WDL  Suicidal Thoughts:  No  Homicidal Thoughts:  No  Memory:  Immediate;   Fair Recent;   Fair Remote;   Fair  Judgement:  Fair  Insight:  Present  Psychomotor Activity:  Normal  Concentration:  Concentration: Fair and Attention Span: Fair  Recall:  [employer] of Knowledge:  Fair  Language:  Fair  Akathisia:  No  Handed:  Right  AIMS (if indicated):     Assets:  Communication Skills Desire for Improvement Housing Physical Health Resilience Social Support Transportation Vocational/Educational  ADL's:  Intact  Cognition:  WNL  Sleep:  Number  of Hours: 7     Have you used any form of tobacco in the last 30 days? (Cigarettes, Smokeless Tobacco, Cigars, and/or Pipes): Yes  Has this patient used any form of tobacco in the last 30 days? (Cigarettes, Smokeless Tobacco, Cigars, and/or Pipes) Yes, Yes, A prescription for an FDA-approved tobacco cessation medication was offered at discharge and the patient refused  Blood Alcohol level:  Lab Results  Component Value Date   ETH <10 06/20/2017    Metabolic Disorder Labs:  No results found for: HGBA1C, MPG No results found for: PROLACTIN Lab Results  Component Value Date   CHOL 178 06/20/2018   TRIG 125 06/20/2018   HDL 47 06/20/2018   CHOLHDL 3.8 06/20/2018   VLDL 25 06/20/2018   LDLCALC 106 (H) 06/20/2018    See Psychiatric Specialty Exam and Suicide Risk Assessment completed by Attending Physician prior to discharge.  Discharge destination:  Home  Is patient on multiple antipsychotic therapies at discharge:  No   Has Patient had three or more failed trials of  antipsychotic monotherapy by history:  No  Recommended Plan for Multiple Antipsychotic Therapies: NA  Discharge Instructions    Diet - low sodium heart healthy   Complete by:  As directed    Increase activity slowly   Complete by:  As directed      Allergies as of 06/20/2018      Reactions   Amoxicillin Rash      Medication List    STOP taking these medications   ondansetron 4 MG disintegrating tablet Commonly known as:  ZOFRAN ODT     TAKE these medications     Indication  albuterol 108 (90 Base) MCG/ACT inhaler Commonly known as:  PROVENTIL HFA;VENTOLIN HFA Inhale 2 puffs into the lungs every 4 (four) hours as needed for wheezing or shortness of breath.  Indication:  Disease Involving Spasms of the Bronchus   ARIPiprazole 10 MG tablet Commonly known as:  ABILIFY Take 1 tablet (10 mg total) by mouth daily. Start taking on:  June 21, 2018  Indication:  MIXED BIPOLAR AFFECTIVE DISORDER   fluvoxaMINE 100 MG tablet Commonly known as:  LUVOX Take 1 tablet (100 mg total) by mouth at bedtime.  Indication:  Obsessive Compulsive Disorder, Panic Disorder, Posttraumatic Stress Disorder   hydrochlorothiazide 12.5 MG capsule Commonly known as:  MICROZIDE Take 1 capsule (12.5 mg total) by mouth daily.  Indication:  High Blood Pressure Disorder   hydrOXYzine 25 MG tablet Commonly known as:  ATARAX/VISTARIL Take 1 tablet (25 mg total) by mouth every 6 (six) hours as needed for anxiety (mild-moderate).  Indication:  Feeling Anxious   Oxcarbazepine 300 MG tablet Commonly known as:  TRILEPTAL Take 1 tablet (300 mg total) by mouth 2 (two) times daily.  Indication:  bipolar disorder   traZODone 50 MG tablet Commonly known as:  DESYREL Take 1 tablet (50 mg total) by mouth at bedtime as needed for sleep.  Indication:  Trouble Sleeping      Follow-up Information    [facility] Follow up.   Contact information: [address] [place] [address] [phone]            Follow-up recommendations:  Activity:  as tolerated Diet:  low sodium heart healthy Other:  keep follow up appointments  Comments:    Signed: Kristine Linea, MD 06/20/2018, 9:38 AM

## 2018-10-14 ENCOUNTER — Encounter: Payer: Self-pay | Admitting: Emergency Medicine

## 2018-10-14 ENCOUNTER — Other Ambulatory Visit: Payer: Self-pay

## 2018-10-14 ENCOUNTER — Emergency Department
Admission: EM | Admit: 2018-10-14 | Discharge: 2018-10-14 | Disposition: A | Discharge status: Home / Self Care | Payer: Self-pay | Attending: Emergency Medicine | Admitting: Emergency Medicine

## 2018-10-14 DIAGNOSIS — I10 Essential (primary) hypertension: Secondary | ICD-10-CM | POA: Insufficient documentation

## 2018-10-14 DIAGNOSIS — F17210 Nicotine dependence, cigarettes, uncomplicated: Secondary | ICD-10-CM | POA: Insufficient documentation

## 2018-10-14 DIAGNOSIS — Z79899 Other long term (current) drug therapy: Secondary | ICD-10-CM | POA: Insufficient documentation

## 2018-10-14 DIAGNOSIS — F419 Anxiety disorder, unspecified: Secondary | ICD-10-CM

## 2018-10-14 DIAGNOSIS — F319 Bipolar disorder, unspecified: Secondary | ICD-10-CM | POA: Insufficient documentation

## 2018-10-14 DIAGNOSIS — F4310 Post-traumatic stress disorder, unspecified: Secondary | ICD-10-CM | POA: Insufficient documentation

## 2018-10-14 LAB — COMPREHENSIVE METABOLIC PANEL
ALT: 15 U/L (ref 0–44)
AST: 15 U/L (ref 15–41)
Albumin: 4.8 g/dL (ref 3.5–5.0)
Alkaline Phosphatase: 86 U/L (ref 38–126)
Anion gap: 9 (ref 5–15)
BUN: 9 mg/dL (ref 6–20)
CO2: 25 mmol/L (ref 22–32)
Calcium: 9.5 mg/dL (ref 8.9–10.3)
Chloride: 106 mmol/L (ref 98–111)
Creatinine, Ser: 0.76 mg/dL (ref 0.44–1.00)
GFR calc Af Amer: >60 mL/min (ref >60)
GFR calc non Af Amer: >60 mL/min (ref >60)
Glucose, Bld: 94 mg/dL (ref 70–99)
Potassium: 3.8 mmol/L (ref 3.5–5.1)
Sodium: 140 mmol/L (ref 135–145)
Total Bilirubin: 0.3 mg/dL (ref 0.3–1.2)
Total Protein: 7.8 g/dL (ref 6.5–8.1)

## 2018-10-14 LAB — PREGNANCY, URINE: Preg Test, Ur: NEGATIVE

## 2018-10-14 LAB — CBC
HCT: 39.7 % (ref 36.0–46.0)
Hemoglobin: 12.6 g/dL (ref 12.0–15.0)
MCH: 24.5 pg — ABNORMAL LOW (ref 26.0–34.0)
MCHC: 31.7 g/dL (ref 30.0–36.0)
MCV: 77.2 fL — ABNORMAL LOW (ref 80.0–100.0)
Platelets: 343 10*3/uL (ref 150–400)
RBC: 5.14 MIL/uL — ABNORMAL HIGH (ref 3.87–5.11)
RDW: 13.4 % (ref 11.5–15.5)
WBC: 10.5 10*3/uL (ref 4.0–10.5)
nRBC: 0 % (ref 0.0–0.2)

## 2018-10-14 LAB — URINE DRUG SCREEN, QUALITATIVE (ARMC ONLY)
Amphetamines, Ur Screen: NOT DETECTED
Barbiturates, Ur Screen: NOT DETECTED
Benzodiazepine, Ur Scrn: NOT DETECTED
Cannabinoid 50 Ng, Ur ~~LOC~~: POSITIVE — AB
Cocaine Metabolite,Ur ~~LOC~~: NOT DETECTED
MDMA (Ecstasy)Ur Screen: NOT DETECTED
Methadone Scn, Ur: NOT DETECTED
Opiate, Ur Screen: NOT DETECTED
Phencyclidine (PCP) Ur S: NOT DETECTED
Tricyclic, Ur Screen: NOT DETECTED

## 2018-10-14 LAB — ETHANOL: Alcohol, Ethyl (B): <10 mg/dL (ref <10)

## 2018-10-14 LAB — SALICYLATE LEVEL: Salicylate Lvl: <7 mg/dL (ref 2.8–30.0)

## 2018-10-14 LAB — ACETAMINOPHEN LEVEL: Acetaminophen (Tylenol), Serum: <10 ug/mL — ABNORMAL LOW (ref 10–30)

## 2018-10-14 MED ORDER — HYDROXYZINE HCL 10 MG PO TABS
10.0000 mg | ORAL_TABLET | Freq: Three times a day (TID) | ORAL | Status: DC | PRN
  Administered 2018-10-14: 10 mg via ORAL
  Filled 2018-10-14 (×2): qty 1

## 2018-10-14 MED ORDER — HYDROXYZINE HCL 10 MG PO TABS: 10.0000 mg | ORAL_TABLET | Freq: Three times a day (TID) | ORAL | 1 refills | Status: DC | PRN

## 2018-10-14 NOTE — ED Triage Notes (Signed)
Patient was sent to the ED by outpatient psychiatric services for increasing anxiety, phobias and suicidal ideation.  Patient is tearful when sh arrives to ED.

## 2018-10-14 NOTE — Discharge Instructions (Addendum)
Follow up with [facility], walk-in clinics were available prior to COVID, opens at 8:30 am Mon-Fri. [phone]. You can also follow up with [facility].  Please return for any problems.

## 2018-10-14 NOTE — BH Assessment (Signed)
Tele-Assessment when patient is medically clear/returned labs. Number for jabber/tele is [phone]

## 2018-10-14 NOTE — ED Notes (Signed)
This RN and Katie, NT dressed pt out. Pt has one pair of slippers, one pair of black pants, one undershirt, one tshirt, 3 rings, 5 colored jelly bracelets, one pair of green underwear, one phone with a red case that pt turned off, one black purse with wallet inside. Belongings all placed in one bag with pt sticker. Pt verbalized under standing of keeping things until she is discharged.

## 2018-10-14 NOTE — BHH Suicide Risk Assessment (Signed)
Patient denies suicidal/homicidal, hallucinations, and substance abuse. Reports she was having anxiety, hydroxyzine 10 mg TID PRN anxiety with 20 mg at bedtime PRN sleep provided with instructions.  She plans to follow up with [facility] in [place], [place] and has Mobile Crisis numbers if needed.  Discussed grounding herself with the "5 senses" exercise and alternate nare breathing to calm herself along with bending over to regulate her breathing with compression of the diaphragm when she finds herself getting anxious.  Calm and pleasant on assessment.  Nanine Means, PMHNP
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
## 2018-10-21 NOTE — ED Provider Notes (Signed)
[facility] Emergency Department Provider Note   ____________________________________________   First MD Initiated Contact with Patient 10/14/18 1343     (approximate)  I have reviewed the triage vital signs and the nursing notes.   HISTORY  Chief Complaint Psychiatric Evaluation    HPI Paula Keller is a 28 y.o. female who was sent to the ER by outpatient psychiatric services for increasing anxiety and phobias and suicidal ideation.  Patient is anxious and tearful.  She first agrees with and and denies suicidal ideation.  The other medical type problems.         Past Medical History:  Diagnosis Date  . Hypertension     Patient Active Problem List   Diagnosis Date Noted  . PTSD (post-traumatic stress disorder) 06/19/2018  . OCD (obsessive compulsive disorder) 06/19/2018  . Bipolar I disorder, current or most recent episode depressed, with psychotic features ([place]) 06/18/2018  . Tobacco use disorder 06/18/2018  . Cannabis use disorder, moderate, dependence ([place]) 06/18/2018  . HTN (hypertension) 06/18/2018    Past Surgical History:  Procedure Laterality Date  . TONSILLECTOMY      Prior to Admission medications   Medication Sig Start Date End Date Taking? Authorizing Provider  albuterol (PROVENTIL HFA;VENTOLIN HFA) 108 (90 Base) MCG/ACT inhaler Inhale 2 puffs into the lungs every 4 (four) hours as needed for wheezing or shortness of breath. 06/30/15   Cuthriell, Charline Bills, PA-C  hydrOXYzine (ATARAX/VISTARIL) 10 MG tablet Take 1 tablet (10 mg total) by mouth 3 (three) times daily as needed for anxiety. May take tablets at bedtime as needed for sleep 10/14/18   Patrecia Pour, NP    Allergies Amoxicillin  Family History  Adopted: Yes    Social History Social History   Tobacco Use  . Smoking status: Current Every Day Smoker    Packs/day: 0.50    Types: Cigarettes  . Smokeless tobacco: Never Used  Substance Use Topics  .  Alcohol use: No    Frequency: Never  . Drug use: Yes    Types: Marijuana    Review of Systems  Constitutional: No fever/chills Eyes: No visual changes. ENT: No sore throat. Cardiovascular: Denies chest pain. Respiratory: Denies shortness of breath. Gastrointestinal: No abdominal pain.  No nausea, no vomiting.  No diarrhea.  No constipation. Genitourinary: Negative for dysuria. Musculoskeletal: Negative for back pain. Skin: Negative for rash. Neurological: Negative for headaches, focal weakness   ____________________________________________   PHYSICAL EXAM:  VITAL SIGNS: ED Triage Vitals  Enc Vitals Group     BP 10/14/18 1349 (!) 170/108     Pulse Rate 10/14/18 1349 72     Resp 10/14/18 1349 16     Temp 10/14/18 1357 98.2 F (36.8 C)     Temp Source 10/14/18 1357 Oral     SpO2 10/14/18 1349 100 %     Weight 10/14/18 1313 235 lb (106.6 kg)     Height 10/14/18 1313 5\' 4"  (1.626 m)     Head Circumference --      Peak Flow --      Pain Score 10/14/18 1313 0     Pain Loc --      Pain Edu? --      Excl. in [place]? --     Constitutional: Alert and oriented.  Anxious and upset Eyes: Conjunctivae are normal.  Head: Atraumatic. Nose: No congestion/rhinnorhea. Mouth/Throat: Mucous membranes are moist.  Oropharynx non-erythematous. Neck: No stridor.  Cardiovascular: Normal rate, regular rhythm.  Grossly normal heart sounds.  Good peripheral circulation. Respiratory: Normal respiratory effort.  No retractions. Lungs CTAB. Gastrointestinal: Soft and nontender. No distention. No abdominal bruits. No CVA tenderness. Musculoskeletal: No lower extremity tenderness nor edema. . Neurologic:  Normal speech and language. No gross focal neurologic deficits are appreciated..   ____________________________________________   LABS (all labs ordered are listed, but only abnormal results are displayed)  Labs Reviewed  ACETAMINOPHEN LEVEL - Abnormal; Notable for the following components:       Result Value   Acetaminophen (Tylenol), Serum <10 (*)    All other components within normal limits  CBC - Abnormal; Notable for the following components:   RBC 5.14 (*)    MCV 77.2 (*)    MCH 24.5 (*)    All other components within normal limits  URINE DRUG SCREEN, QUALITATIVE ([facility] ONLY) - Abnormal; Notable for the following components:   Cannabinoid 50 Ng, Ur [place] POSITIVE (*)    All other components within normal limits  COMPREHENSIVE METABOLIC PANEL  ETHANOL  SALICYLATE LEVEL  PREGNANCY, URINE   ____________________________________________  EKG   ____________________________________________  RADIOLOGY  ED MD interpretation:    Official radiology report(s): No results found.  ____________________________________________   PROCEDURES  Procedure(s) performed (including Critical Care):  Procedures   ____________________________________________   INITIAL IMPRESSION / ASSESSMENT AND PLAN / ED COURSE     Paula Keller was evaluated in Emergency Department on 10/21/2018 for the symptoms described in the history of present illness. She was evaluated in the context of the global COVID-19 pandemic, which necessitated consideration that the patient might be at risk for infection with the SARS-CoV-2 virus that causes COVID-19. Institutional protocols and algorithms that pertain to the evaluation of patients at risk for COVID-19 are in a state of rapid change based on information released by regulatory bodies including the CDC and federal and state organizations. These policies and algorithms were followed during the patient's care in the ED.          ____________________________________________   FINAL CLINICAL IMPRESSION(S) / ED DIAGNOSES  Final diagnoses:  PTSD (post-traumatic stress disorder)  Anxiety     ED Discharge Orders         Ordered    hydrOXYzine (ATARAX/VISTARIL) 10 MG tablet  3 times daily PRN     10/14/18 1551    Increase activity  slowly     10/14/18 1551    Diet - low sodium heart healthy     10/14/18 1551    Discharge instructions    Comments:  [facility]   10/14/18 1551           Note:  This document was prepared using Dragon voice recognition software and may include unintentional dictation errors.    Arnaldo NatalMalinda,  F, MD 10/21/18 [phone]

## 2022-10-04 ENCOUNTER — Emergency Department
Admission: EM | Admit: 2022-10-04 | Discharge: 2022-10-04 | Discharge status: Against Medical Advice | Payer: Medicaid Other | Attending: Emergency Medicine | Admitting: Emergency Medicine

## 2022-10-04 DIAGNOSIS — Z5321 Procedure and treatment not carried out due to patient leaving prior to being seen by health care provider: Secondary | ICD-10-CM | POA: Insufficient documentation

## 2022-10-04 DIAGNOSIS — O219 Vomiting of pregnancy, unspecified: Secondary | ICD-10-CM | POA: Insufficient documentation

## 2022-10-04 DIAGNOSIS — Z3A08 8 weeks gestation of pregnancy: Secondary | ICD-10-CM | POA: Diagnosis not present

## 2022-10-04 LAB — CBC
HCT: 39.1 % (ref 36.0–46.0)
Hemoglobin: 12.9 g/dL (ref 12.0–15.0)
MCH: 25.6 pg — ABNORMAL LOW (ref 26.0–34.0)
MCHC: 33 g/dL (ref 30.0–36.0)
MCV: 77.6 fL — ABNORMAL LOW (ref 80.0–100.0)
Platelets: 409 10*3/uL — ABNORMAL HIGH (ref 150–400)
RBC: 5.04 MIL/uL (ref 3.87–5.11)
RDW: 13.6 % (ref 11.5–15.5)
WBC: 16 10*3/uL — ABNORMAL HIGH (ref 4.0–10.5)
nRBC: 0 % (ref 0.0–0.2)

## 2022-10-04 LAB — COMPREHENSIVE METABOLIC PANEL
ALT: 22 U/L (ref 0–44)
AST: 23 U/L (ref 15–41)
Albumin: 4.8 g/dL (ref 3.5–5.0)
Alkaline Phosphatase: 86 U/L (ref 38–126)
Anion gap: 12 (ref 5–15)
BUN: 5 mg/dL — ABNORMAL LOW (ref 6–20)
CO2: 21 mmol/L — ABNORMAL LOW (ref 22–32)
Calcium: 9.4 mg/dL (ref 8.9–10.3)
Chloride: 103 mmol/L (ref 98–111)
Creatinine, Ser: 0.66 mg/dL (ref 0.44–1.00)
GFR, Estimated: >60 mL/min (ref >60)
Glucose, Bld: 128 mg/dL — ABNORMAL HIGH (ref 70–99)
Potassium: 3.8 mmol/L (ref 3.5–5.1)
Sodium: 136 mmol/L (ref 135–145)
Total Bilirubin: 0.6 mg/dL (ref 0.3–1.2)
Total Protein: 8.1 g/dL (ref 6.5–8.1)

## 2022-10-04 LAB — HCG, QUANTITATIVE, PREGNANCY: hCG, Beta Chain, Quant, S: 90918 m[IU]/mL — ABNORMAL HIGH (ref <5)

## 2022-10-04 LAB — LIPASE, BLOOD: Lipase: 23 U/L (ref 11–51)

## 2022-10-04 NOTE — ED Triage Notes (Addendum)
Pt states she is [redacted] weeks pregnant, she has had nausea and vomiting sine 1600 this afternoon. Pt sees [facility] for OBGYN care. Pt states she took zofran pta but unable to keep it down. Pt also feels like she has a migraine. Pt denies abd pain or vaginal bleeding.

## 2022-10-18 DIAGNOSIS — F419 Anxiety disorder, unspecified: Secondary | ICD-10-CM | POA: Insufficient documentation

## 2022-10-18 DIAGNOSIS — F1290 Cannabis use, unspecified, uncomplicated: Secondary | ICD-10-CM | POA: Insufficient documentation

## 2022-10-18 DIAGNOSIS — F17200 Nicotine dependence, unspecified, uncomplicated: Secondary | ICD-10-CM | POA: Insufficient documentation

## 2022-11-11 ENCOUNTER — Encounter: Payer: Self-pay | Admitting: Emergency Medicine

## 2022-11-11 ENCOUNTER — Emergency Department: Payer: Medicaid Other

## 2022-11-11 ENCOUNTER — Other Ambulatory Visit: Payer: Self-pay

## 2022-11-11 ENCOUNTER — Emergency Department
Admission: EM | Admit: 2022-11-11 | Discharge: 2022-11-11 | Disposition: A | Discharge status: Home / Self Care | Payer: Medicaid Other | Attending: Emergency Medicine | Admitting: Emergency Medicine

## 2022-11-11 DIAGNOSIS — Z3A13 13 weeks gestation of pregnancy: Secondary | ICD-10-CM | POA: Insufficient documentation

## 2022-11-11 DIAGNOSIS — O209 Hemorrhage in early pregnancy, unspecified: Secondary | ICD-10-CM | POA: Insufficient documentation

## 2022-11-11 DIAGNOSIS — O4690 Antepartum hemorrhage, unspecified, unspecified trimester: Secondary | ICD-10-CM

## 2022-11-11 LAB — COMPREHENSIVE METABOLIC PANEL
ALT: 25 U/L (ref 0–44)
AST: 17 U/L (ref 15–41)
Albumin: 3.7 g/dL (ref 3.5–5.0)
Alkaline Phosphatase: 89 U/L (ref 38–126)
Anion gap: 10 (ref 5–15)
BUN: 5 mg/dL — ABNORMAL LOW (ref 6–20)
CO2: 19 mmol/L — ABNORMAL LOW (ref 22–32)
Calcium: 9.3 mg/dL (ref 8.9–10.3)
Chloride: 107 mmol/L (ref 98–111)
Creatinine, Ser: 0.55 mg/dL (ref 0.44–1.00)
GFR, Estimated: >60 mL/min (ref >60)
Glucose, Bld: 92 mg/dL (ref 70–99)
Potassium: 3.7 mmol/L (ref 3.5–5.1)
Sodium: 136 mmol/L (ref 135–145)
Total Bilirubin: 0.3 mg/dL (ref 0.3–1.2)
Total Protein: 7.1 g/dL (ref 6.5–8.1)

## 2022-11-11 LAB — CBC WITH DIFFERENTIAL/PLATELET
Abs Immature Granulocytes: 0.05 10*3/uL (ref 0.00–0.07)
Basophils Absolute: 0 10*3/uL (ref 0.0–0.1)
Basophils Relative: 0 %
Eosinophils Absolute: 0.2 10*3/uL (ref 0.0–0.5)
Eosinophils Relative: 2 %
HCT: 32.9 % — ABNORMAL LOW (ref 36.0–46.0)
Hemoglobin: 11 g/dL — ABNORMAL LOW (ref 12.0–15.0)
Immature Granulocytes: 1 %
Lymphocytes Relative: 21 %
Lymphs Abs: 2.3 10*3/uL (ref 0.7–4.0)
MCH: 26.1 pg (ref 26.0–34.0)
MCHC: 33.4 g/dL (ref 30.0–36.0)
MCV: 78 fL — ABNORMAL LOW (ref 80.0–100.0)
Monocytes Absolute: 0.6 10*3/uL (ref 0.1–1.0)
Monocytes Relative: 5 %
Neutro Abs: 7.9 10*3/uL — ABNORMAL HIGH (ref 1.7–7.7)
Neutrophils Relative %: 71 %
Platelets: 376 10*3/uL (ref 150–400)
RBC: 4.22 MIL/uL (ref 3.87–5.11)
RDW: 13.7 % (ref 11.5–15.5)
WBC: 11.1 10*3/uL — ABNORMAL HIGH (ref 4.0–10.5)
nRBC: 0 % (ref 0.0–0.2)

## 2022-11-11 LAB — URINALYSIS, ROUTINE W REFLEX MICROSCOPIC
Bilirubin Urine: NEGATIVE
Glucose, UA: NEGATIVE mg/dL
Ketones, ur: NEGATIVE mg/dL
Leukocytes,Ua: NEGATIVE
Nitrite: NEGATIVE
Protein, ur: 30 mg/dL — AB
Specific Gravity, Urine: 1.011 (ref 1.005–1.030)
pH: 7 (ref 5.0–8.0)

## 2022-11-11 LAB — HCG, QUANTITATIVE, PREGNANCY: hCG, Beta Chain, Quant, S: 46691 m[IU]/mL — ABNORMAL HIGH (ref <5)

## 2022-11-11 MED ORDER — PROMETHAZINE HCL 25 MG PO TABS
25.0000 mg | ORAL_TABLET | Freq: Once | ORAL | Status: AC
  Administered 2022-11-11: 25 mg via ORAL
  Filled 2022-11-11: qty 1

## 2022-11-11 MED ORDER — CEPHALEXIN 500 MG PO CAPS
500.0000 mg | ORAL_CAPSULE | Freq: Once | ORAL | Status: AC
  Administered 2022-11-11: 500 mg via ORAL
  Filled 2022-11-11: qty 1

## 2022-11-11 MED ORDER — CEPHALEXIN 500 MG PO CAPS: 500.0000 mg | ORAL_CAPSULE | Freq: Four times a day (QID) | ORAL | 0 refills | Status: DC

## 2022-11-11 NOTE — ED Triage Notes (Signed)
Patient reports vaginal bleeding and abdominal cramping and back pain that started yesterday.  Patient reports it got worse today and blood became bright red in color.  Patient is [redacted] weeks pregnant, G3P2, routine prenatal care.

## 2022-11-11 NOTE — ED Provider Notes (Signed)
[facility] Provider Note  Patient Contact: 10:13 PM (approximate)   History   Vaginal Bleeding ([redacted] weeks pregnant)   HPI  Paula Keller is a 32 y.o. female who presents emergency department with vaginal bleeding in pregnancy.  Patient is roughly [redacted] weeks pregnant.  Patient states that she has been hiking with her kids but no direct trauma to her abdomen.  She has had some cramps but no vaginal discharge, dysuria, polyuria, hematuria or fevers.  Patient states that the blood was bright red but no clots.     Physical Exam   Triage Vital Signs: ED Triage Vitals  Enc Vitals Group     BP 11/11/22 2010 (!) 141/95     Pulse Rate 11/11/22 2010 83     Resp 11/11/22 2010 18     Temp 11/11/22 2010 98.2 F (36.8 C)     Temp Source 11/11/22 2010 Oral     SpO2 11/11/22 2010 100 %     Weight 11/11/22 2011 260 lb (117.9 kg)     Height 11/11/22 2011 5\' 5"  (1.651 m)     Head Circumference --      Peak Flow --      Pain Score 11/11/22 2011 5     Pain Loc --      Pain Edu? --      Excl. in [place]? --     Most recent vital signs: Vitals:   11/11/22 2010  BP: (!) 141/95  Pulse: 83  Resp: 18  Temp: 98.2 F (36.8 C)  SpO2: 100%     General: Alert and in no acute distress.  Cardiovascular:  Good peripheral perfusion Respiratory: Normal respiratory effort without tachypnea or retractions. Lungs CTAB.  Gastrointestinal: Bowel sounds 4 quadrants. Soft and nontender to palpation. No guarding or rigidity. No palpable masses. No distention. No CVA tenderness. Musculoskeletal: Full range of motion to all extremities.  Neurologic:  No gross focal neurologic deficits are appreciated.  Skin:   No rash noted Other:   ED Results / Procedures / Treatments   Labs (all labs ordered are listed, but only abnormal results are displayed) Labs Reviewed  CBC WITH DIFFERENTIAL/PLATELET - Abnormal; Notable for the following components:      Result Value   WBC  11.1 (*)    Hemoglobin 11.0 (*)    HCT 32.9 (*)    MCV 78.0 (*)    Neutro Abs 7.9 (*)    All other components within normal limits  COMPREHENSIVE METABOLIC PANEL - Abnormal; Notable for the following components:   CO2 19 (*)    BUN 5 (*)    All other components within normal limits  HCG, QUANTITATIVE, PREGNANCY - Abnormal; Notable for the following components:   hCG, Beta Chain, Quant, S 04,540 (*)    All other components within normal limits  URINALYSIS, ROUTINE W REFLEX MICROSCOPIC - Abnormal; Notable for the following components:   Color, Urine YELLOW (*)    APPearance CLOUDY (*)    Hgb urine dipstick LARGE (*)    Protein, ur 30 (*)    Bacteria, UA MANY (*)    All other components within normal limits  ABO/RH     EKG     RADIOLOGY  I personally viewed, evaluated, and interpreted these images as part of my medical decision making, as well as reviewing the written report by the radiologist.  ED Provider Interpretation: Intrauterine pregnancy with no acute abnormality found.  US OB Comp  Less 14 Wks  Result Date: 11/11/2022 CLINICAL DATA:  Vaginal bleeding with positive pregnancy test EXAM: OBSTETRIC <14 WK ULTRASOUND TECHNIQUE: Transabdominal ultrasound was performed for evaluation of the gestation as well as the maternal uterus and adnexal regions. COMPARISON:  None Available. FINDINGS: Intrauterine gestational sac: Present Yolk sac:  Absent Embryo:  Present Cardiac Activity: Present Heart Rate: 164 bpm CRL:   74.2 mm   13 w 4 d                  [place] EDC: 05/15/2023 Subchorionic hemorrhage:  None visualized. Maternal uterus/adnexae: Ovaries are within normal limits. Small left ovarian cyst is noted measuring 1.7 cm. IMPRESSION: Single live intrauterine gestation at 13 weeks 4 days. No acute abnormality noted. Electronically Signed   By: Alcide Clever M.D.   On: 11/11/2022 21:53    PROCEDURES:  Critical Care performed: No  Procedures   MEDICATIONS ORDERED IN  ED: Medications  cephALEXin (KEFLEX) capsule 500 mg (has no administration in time range)  promethazine (PHENERGAN) tablet 25 mg (25 mg Oral Given 11/11/22 2104)     IMPRESSION / MDM / ASSESSMENT AND PLAN / ED COURSE  I reviewed the triage vital signs and the nursing notes.                                 Differential diagnosis includes, but is not limited to, vaginal bleeding in pregnancy, miscarriage, subchorionic hematoma, placenta previa, placental abruption   Patient's presentation is most consistent with acute presentation with potential threat to life or bodily function.   Patient's diagnosis is consistent with vaginal bleeding in pregnancy.  Patient presents emergency department with some vaginal bleeding today.  Patient has had some abdominal cramping but no frank abdominal pain.  No urinary changes.  She does have some emesis associated with pregnancy but no change from baseline.  Patient's labs, imaging is reassuring.  No evidence of subchorionic hematoma or other concerning findings on ultrasound.  She did have some bacteria in her urine and though she is asymptomatic I will treat for asymptomatic bacteriuria in pregnancy.  Will start patient on Keflex for same.  Follow-up with OB/GYN as already scheduled in 3 days..  Patient is given ED precautions to return to the ED for any worsening or new symptoms.     FINAL CLINICAL IMPRESSION(S) / ED DIAGNOSES   Final diagnoses:  Vaginal bleeding in pregnancy     Rx / DC Orders   ED Discharge Orders          Ordered    cephALEXin (KEFLEX) 500 MG capsule  4 times daily        11/11/22 2237             Note:  This document was prepared using Dragon voice recognition software and may include unintentional dictation errors.   Lanette Hampshire 11/11/22 2237    Pilar Jarvis, MD 11/12/22 2029

## 2022-11-12 LAB — ABO/RH: ABO/RH(D): A POS

## 2022-12-14 ENCOUNTER — Other Ambulatory Visit: Payer: Self-pay | Admitting: Obstetrics and Gynecology

## 2022-12-14 DIAGNOSIS — O99212 Obesity complicating pregnancy, second trimester: Secondary | ICD-10-CM

## 2022-12-14 DIAGNOSIS — Z363 Encounter for antenatal screening for malformations: Secondary | ICD-10-CM

## 2022-12-20 ENCOUNTER — Emergency Department: Payer: Medicaid Other

## 2022-12-20 ENCOUNTER — Ambulatory Visit: Payer: Medicaid Other

## 2022-12-20 ENCOUNTER — Emergency Department
Admission: EM | Admit: 2022-12-20 | Discharge: 2022-12-20 | Disposition: A | Discharge status: Home / Self Care | Payer: Medicaid Other | Attending: Emergency Medicine | Admitting: Emergency Medicine

## 2022-12-20 ENCOUNTER — Other Ambulatory Visit: Payer: Medicaid Other

## 2022-12-20 DIAGNOSIS — Z3A19 19 weeks gestation of pregnancy: Secondary | ICD-10-CM | POA: Insufficient documentation

## 2022-12-20 DIAGNOSIS — O99112 Other diseases of the blood and blood-forming organs and certain disorders involving the immune mechanism complicating pregnancy, second trimester: Secondary | ICD-10-CM | POA: Insufficient documentation

## 2022-12-20 DIAGNOSIS — O219 Vomiting of pregnancy, unspecified: Secondary | ICD-10-CM | POA: Diagnosis present

## 2022-12-20 DIAGNOSIS — R112 Nausea with vomiting, unspecified: Secondary | ICD-10-CM

## 2022-12-20 DIAGNOSIS — O10012 Pre-existing essential hypertension complicating pregnancy, second trimester: Secondary | ICD-10-CM | POA: Diagnosis not present

## 2022-12-20 DIAGNOSIS — D72829 Elevated white blood cell count, unspecified: Secondary | ICD-10-CM | POA: Diagnosis not present

## 2022-12-20 LAB — COMPREHENSIVE METABOLIC PANEL
ALT: 35 U/L (ref 0–44)
AST: 24 U/L (ref 15–41)
Albumin: 3.7 g/dL (ref 3.5–5.0)
Alkaline Phosphatase: 111 U/L (ref 38–126)
Anion gap: 10 (ref 5–15)
BUN: <5 mg/dL — ABNORMAL LOW (ref 6–20)
CO2: 21 mmol/L — ABNORMAL LOW (ref 22–32)
Calcium: 9.7 mg/dL (ref 8.9–10.3)
Chloride: 104 mmol/L (ref 98–111)
Creatinine, Ser: 0.66 mg/dL (ref 0.44–1.00)
GFR, Estimated: >60 mL/min (ref >60)
Glucose, Bld: 94 mg/dL (ref 70–99)
Potassium: 3.8 mmol/L (ref 3.5–5.1)
Sodium: 135 mmol/L (ref 135–145)
Total Bilirubin: 0.7 mg/dL (ref 0.3–1.2)
Total Protein: 7.4 g/dL (ref 6.5–8.1)

## 2022-12-20 LAB — CBC
HCT: 34.9 % — ABNORMAL LOW (ref 36.0–46.0)
Hemoglobin: 11.8 g/dL — ABNORMAL LOW (ref 12.0–15.0)
MCH: 26.2 pg (ref 26.0–34.0)
MCHC: 33.8 g/dL (ref 30.0–36.0)
MCV: 77.4 fL — ABNORMAL LOW (ref 80.0–100.0)
Platelets: 440 10*3/uL — ABNORMAL HIGH (ref 150–400)
RBC: 4.51 MIL/uL (ref 3.87–5.11)
RDW: 13.6 % (ref 11.5–15.5)
WBC: 14.9 10*3/uL — ABNORMAL HIGH (ref 4.0–10.5)
nRBC: 0 % (ref 0.0–0.2)

## 2022-12-20 LAB — URINALYSIS, ROUTINE W REFLEX MICROSCOPIC
Bilirubin Urine: NEGATIVE
Glucose, UA: NEGATIVE mg/dL
Hgb urine dipstick: NEGATIVE
Ketones, ur: 20 mg/dL — AB
Leukocytes,Ua: NEGATIVE
Nitrite: NEGATIVE
Protein, ur: 30 mg/dL — AB
Specific Gravity, Urine: 1.02 (ref 1.005–1.030)
pH: 5 (ref 5.0–8.0)

## 2022-12-20 LAB — LIPASE, BLOOD: Lipase: 26 U/L (ref 11–51)

## 2022-12-20 MED ORDER — FAMOTIDINE 20 MG PO TABS: 20.0000 mg | ORAL_TABLET | Freq: Every day | ORAL | 0 refills | Status: AC

## 2022-12-20 MED ORDER — METOCLOPRAMIDE HCL 10 MG PO TABS: 10.0000 mg | ORAL_TABLET | Freq: Three times a day (TID) | ORAL | 1 refills | Status: AC | PRN

## 2022-12-20 MED ORDER — METOCLOPRAMIDE HCL 5 MG/ML IJ SOLN
5.0000 mg | Freq: Once | INTRAMUSCULAR | Status: AC
  Administered 2022-12-20: 5 mg via INTRAVENOUS
  Filled 2022-12-20: qty 2

## 2022-12-20 MED ORDER — FAMOTIDINE IN NACL 20-0.9 MG/50ML-% IV SOLN
20.0000 mg | Freq: Once | INTRAVENOUS | Status: AC
  Administered 2022-12-20: 20 mg via INTRAVENOUS
  Filled 2022-12-20: qty 50

## 2022-12-20 MED ORDER — SODIUM CHLORIDE 0.9 % IV BOLUS
1000.0000 mL | Freq: Once | INTRAVENOUS | Status: AC
  Administered 2022-12-20: 1000 mL via INTRAVENOUS

## 2022-12-20 NOTE — ED Notes (Signed)
Patient able to eat saltines and drink water without difficulties.

## 2022-12-20 NOTE — Discharge Instructions (Addendum)
You were seen in the emergency department for nausea, vomiting and abdominal pain.  Your lab work was overall normal.  You are given IV fluids for your dehydration.  You are given IV nausea medicine with Reglan.  You had an ultrasound done of your gallbladder that showed signs of gallstones but did not show an infected gallbladder.  You are given information for gallbladder eating plan, avoid any fatty foods.  If your symptoms recur you may need evaluation by a surgeon, you are given information for general surgery, call for close follow-up.  Follow-up closely with your primary care provider and your obstetrician.  You are started on an acid reducing medication and given medication for nausea and vomiting.  Stay hydrated and drink plenty of fluids.  Return to the emergency department for any worsening symptoms.

## 2022-12-20 NOTE — ED Triage Notes (Signed)
Pt reports being [redacted]weeks pregnant and having n/v for the past three days, not being able to keep any food down. Pt reports hx of hypertension being her only medical issue.

## 2022-12-20 NOTE — ED Provider Notes (Signed)
[facility] Provider Note    Event Date/Time   First MD Initiated Contact with Patient 12/20/22 1246     (approximate)   History   Emesis   HPI  Paula Keller is a 32 y.o. female past medical history seen again for hypertension, G3 P2 at approximately [redacted] weeks gestational age who presents to the emergency department with nausea and vomiting.  States that she has been having multiple episodes of nausea and vomiting during this pregnancy.  Has Reglan at home.  Over the past couple of days states that she has been having multiple episodes of nausea and vomiting and has been having difficulty keeping anything down.  Denies any significant abdominal pain at this time.  No dysuria.  No history of kidney stone.  No fever or chills.  No diarrhea or blood in her stool.  Good fetal movement.  No leakage of fluids.  No vaginal bleeding.     Physical Exam   Triage Vital Signs: ED Triage Vitals  Encounter Vitals Group     BP 12/20/22 1108 (!) 143/93     Systolic BP Percentile --      Diastolic BP Percentile --      Pulse Rate 12/20/22 1108 100     Resp 12/20/22 1108 16     Temp 12/20/22 1108 98.1 F (36.7 C)     Temp Source 12/20/22 1108 Oral     SpO2 12/20/22 1108 99 %     Weight 12/20/22 1109 264 lb (119.7 kg)     Height 12/20/22 1109 5\' 4"  (1.626 m)     Head Circumference --      Peak Flow --      Pain Score 12/20/22 1109 0     Pain Loc --      Pain Education --      Exclude from Growth Chart --     Most recent vital signs: Vitals:   12/20/22 1530 12/20/22 1630  BP: 124/61 138/84  Pulse: 87 99  Resp: 18 18  Temp:    SpO2: 100% 100%    Physical Exam Constitutional:      Appearance: She is well-developed.  HENT:     Head: Atraumatic.     Mouth/Throat:     Mouth: Mucous membranes are dry.  Eyes:     Conjunctiva/sclera: Conjunctivae normal.  Cardiovascular:     Rate and Rhythm: Regular rhythm.  Pulmonary:     Effort: No  respiratory distress.  Abdominal:     General: There is no distension.     Tenderness: There is abdominal tenderness (mild RUQ).  Musculoskeletal:        General: Normal range of motion.     Cervical back: Normal range of motion.  Skin:    General: Skin is warm.     Capillary Refill: Capillary refill takes less than 2 seconds.  Neurological:     Mental Status: She is alert. Mental status is at baseline.  Psychiatric:        Mood and Affect: Mood normal.     IMPRESSION / MDM / ASSESSMENT AND PLAN / ED COURSE  I reviewed the triage vital signs and the nursing notes.  Differential diagnosis including hyperemesis of pregnancy, dehydration, electrolyte abnormality, gastritis, urinary tract infection   RADIOLOGY my interpretation of imaging: Ultrasound right upper quadrant with findings of gallstones but no signs of acute cholecystitis.  Normal common bile duct.  Sludge but no gallbladder wall thickening  LABS (all labs ordered are listed, but only abnormal results are displayed) Labs interpreted as -    Labs Reviewed  COMPREHENSIVE METABOLIC PANEL - Abnormal; Notable for the following components:      Result Value   CO2 21 (*)    BUN <5 (*)    All other components within normal limits  CBC - Abnormal; Notable for the following components:   WBC 14.9 (*)    Hemoglobin 11.8 (*)    HCT 34.9 (*)    MCV 77.4 (*)    Platelets 440 (*)    All other components within normal limits  URINALYSIS, ROUTINE W REFLEX MICROSCOPIC - Abnormal; Notable for the following components:   Color, Urine AMBER (*)    APPearance CLOUDY (*)    Ketones, ur 20 (*)    Protein, ur 30 (*)    Bacteria, UA RARE (*)    All other components within normal limits  URINE CULTURE  LIPASE, BLOOD  POC URINE PREG, ED     MDM    Mild leukocytosis of 14.9 which is likely in the setting of vomiting.  Creatinine at baseline with no significant electrolyte abnormalities.  Urine with no signs of urinary tract  infection but did have ketones in her urine.  Discussed risk and benefit of all medications with pregnancy.  Patient expressed understanding.  Given Reglan for nausea and vomiting.  Given IV famotidine.  Ultrasound with findings of gallstones but no signs of acute cholecystitis.  Patient's pain has significantly improved and states that she is feeling better.  Tolerating p.o.  Will start the patient on acid reducing medication and given a prescription for antiemetics.  Given information to follow-up with general surgery as an outpatient and discussed foods for acid reflux and for gallstones.  Given return precautions for any return of symptoms.   PROCEDURES:  Critical Care performed: No  Ultrasound ED OB Pelvic  Date/Time: 12/20/2022 4:30 PM  Performed by: Corena Herter, MD Authorized by: Corena Herter, MD   Procedure details:    Indications: pregnant with abdominal pain     Assess:  Intrauterine pregnancy and fetal viability   Technique:  Transabdominal obstetric (HCG+) exam   Images: not archived   Study Limitations: body habitus Uterine findings:    Intrauterine pregnancy: identified     Single gestation: identified     Fetal heart rate: identified     Estimated gestational age: 38 weeks Left ovary findings:    Left ovary:  Not visualized    Right ovary findings:     Right ovary:  Not visualized    Other findings:    Free pelvic fluid: not identified     Free peritoneal fluid: not identified     Patient's presentation is most consistent with acute complicated illness / injury requiring diagnostic workup.   MEDICATIONS ORDERED IN ED: Medications  famotidine (PEPCID) IVPB 20 mg premix (20 mg Intravenous New Bag/Given 12/20/22 1633)  sodium chloride 0.9 % bolus 1,000 mL (0 mLs Intravenous Stopped 12/20/22 1515)  metoCLOPramide (REGLAN) injection 5 mg (5 mg Intravenous Given 12/20/22 1402)    FINAL CLINICAL IMPRESSION(S) / ED DIAGNOSES   Final diagnoses:  Nausea and  vomiting, unspecified vomiting type     Rx / DC Orders   ED Discharge Orders          Ordered    famotidine (PEPCID) 20 MG tablet  Daily        12/20/22 1632    metoCLOPramide (REGLAN) 10  MG tablet  Every 8 hours PRN        12/20/22 1632             Note:  This document was prepared using Dragon voice recognition software and may include unintentional dictation errors.   Corena Herter, MD 12/20/22 [phone]

## 2022-12-21 ENCOUNTER — Other Ambulatory Visit: Payer: Self-pay

## 2022-12-21 ENCOUNTER — Ambulatory Visit: Payer: Medicaid Other | Attending: Obstetrics and Gynecology

## 2022-12-21 DIAGNOSIS — Z3A19 19 weeks gestation of pregnancy: Secondary | ICD-10-CM | POA: Diagnosis not present

## 2022-12-21 DIAGNOSIS — O3412 Maternal care for benign tumor of corpus uteri, second trimester: Secondary | ICD-10-CM | POA: Insufficient documentation

## 2022-12-21 DIAGNOSIS — O99212 Obesity complicating pregnancy, second trimester: Secondary | ICD-10-CM | POA: Diagnosis not present

## 2022-12-21 DIAGNOSIS — O99330 Smoking (tobacco) complicating pregnancy, unspecified trimester: Secondary | ICD-10-CM | POA: Insufficient documentation

## 2022-12-21 DIAGNOSIS — D259 Leiomyoma of uterus, unspecified: Secondary | ICD-10-CM | POA: Insufficient documentation

## 2022-12-21 DIAGNOSIS — O99332 Smoking (tobacco) complicating pregnancy, second trimester: Secondary | ICD-10-CM | POA: Diagnosis not present

## 2022-12-21 DIAGNOSIS — Z363 Encounter for antenatal screening for malformations: Secondary | ICD-10-CM | POA: Insufficient documentation

## 2022-12-21 DIAGNOSIS — E669 Obesity, unspecified: Secondary | ICD-10-CM

## 2022-12-21 DIAGNOSIS — O10012 Pre-existing essential hypertension complicating pregnancy, second trimester: Secondary | ICD-10-CM | POA: Diagnosis not present

## 2022-12-21 DIAGNOSIS — O10919 Unspecified pre-existing hypertension complicating pregnancy, unspecified trimester: Secondary | ICD-10-CM | POA: Insufficient documentation

## 2022-12-21 DIAGNOSIS — F17210 Nicotine dependence, cigarettes, uncomplicated: Secondary | ICD-10-CM

## 2022-12-26 ENCOUNTER — Ambulatory Visit: Payer: Medicaid Other | Admitting: Surgery

## 2023-01-04 ENCOUNTER — Ambulatory Visit: Payer: Medicaid Other | Admitting: Surgery

## 2023-01-18 ENCOUNTER — Other Ambulatory Visit: Payer: Self-pay

## 2023-01-18 DIAGNOSIS — F419 Anxiety disorder, unspecified: Secondary | ICD-10-CM

## 2023-01-18 DIAGNOSIS — O10912 Unspecified pre-existing hypertension complicating pregnancy, second trimester: Secondary | ICD-10-CM

## 2023-01-18 DIAGNOSIS — O99210 Obesity complicating pregnancy, unspecified trimester: Secondary | ICD-10-CM

## 2023-01-18 DIAGNOSIS — F17200 Nicotine dependence, unspecified, uncomplicated: Secondary | ICD-10-CM

## 2023-01-23 ENCOUNTER — Ambulatory Visit: Payer: Medicaid Other | Attending: Obstetrics and Gynecology

## 2023-01-23 ENCOUNTER — Other Ambulatory Visit: Payer: Self-pay

## 2023-01-23 DIAGNOSIS — O99330 Smoking (tobacco) complicating pregnancy, unspecified trimester: Secondary | ICD-10-CM | POA: Diagnosis not present

## 2023-01-23 DIAGNOSIS — E669 Obesity, unspecified: Secondary | ICD-10-CM

## 2023-01-23 DIAGNOSIS — Z3A23 23 weeks gestation of pregnancy: Secondary | ICD-10-CM | POA: Diagnosis not present

## 2023-01-23 DIAGNOSIS — O10012 Pre-existing essential hypertension complicating pregnancy, second trimester: Secondary | ICD-10-CM | POA: Diagnosis not present

## 2023-01-23 DIAGNOSIS — D259 Leiomyoma of uterus, unspecified: Secondary | ICD-10-CM | POA: Diagnosis not present

## 2023-01-23 DIAGNOSIS — O99212 Obesity complicating pregnancy, second trimester: Secondary | ICD-10-CM | POA: Insufficient documentation

## 2023-01-23 DIAGNOSIS — F17200 Nicotine dependence, unspecified, uncomplicated: Secondary | ICD-10-CM

## 2023-01-23 DIAGNOSIS — O10912 Unspecified pre-existing hypertension complicating pregnancy, second trimester: Secondary | ICD-10-CM | POA: Diagnosis present

## 2023-01-23 DIAGNOSIS — O99210 Obesity complicating pregnancy, unspecified trimester: Secondary | ICD-10-CM

## 2023-01-23 DIAGNOSIS — O3412 Maternal care for benign tumor of corpus uteri, second trimester: Secondary | ICD-10-CM | POA: Diagnosis not present

## 2023-01-23 DIAGNOSIS — O99342 Other mental disorders complicating pregnancy, second trimester: Secondary | ICD-10-CM | POA: Diagnosis not present

## 2023-01-23 DIAGNOSIS — O99332 Smoking (tobacco) complicating pregnancy, second trimester: Secondary | ICD-10-CM

## 2023-01-23 DIAGNOSIS — F419 Anxiety disorder, unspecified: Secondary | ICD-10-CM | POA: Diagnosis not present

## 2023-01-23 DIAGNOSIS — F17210 Nicotine dependence, cigarettes, uncomplicated: Secondary | ICD-10-CM

## 2023-02-03 ENCOUNTER — Encounter: Payer: Self-pay | Admitting: Obstetrics and Gynecology

## 2023-02-03 ENCOUNTER — Other Ambulatory Visit: Payer: Self-pay

## 2023-02-03 ENCOUNTER — Observation Stay
Admission: EM | Admit: 2023-02-03 | Discharge: 2023-02-03 | Disposition: A | Discharge status: Home / Self Care | Payer: Medicaid Other | Attending: Obstetrics and Gynecology | Admitting: Obstetrics and Gynecology

## 2023-02-03 DIAGNOSIS — O132 Gestational [pregnancy-induced] hypertension without significant proteinuria, second trimester: Secondary | ICD-10-CM | POA: Diagnosis not present

## 2023-02-03 DIAGNOSIS — O99810 Abnormal glucose complicating pregnancy: Secondary | ICD-10-CM | POA: Diagnosis not present

## 2023-02-03 DIAGNOSIS — E669 Obesity, unspecified: Secondary | ICD-10-CM | POA: Insufficient documentation

## 2023-02-03 DIAGNOSIS — O99332 Smoking (tobacco) complicating pregnancy, second trimester: Secondary | ICD-10-CM | POA: Insufficient documentation

## 2023-02-03 DIAGNOSIS — Z3A25 25 weeks gestation of pregnancy: Secondary | ICD-10-CM | POA: Insufficient documentation

## 2023-02-03 DIAGNOSIS — O26892 Other specified pregnancy related conditions, second trimester: Principal | ICD-10-CM | POA: Insufficient documentation

## 2023-02-03 DIAGNOSIS — O99212 Obesity complicating pregnancy, second trimester: Secondary | ICD-10-CM | POA: Diagnosis not present

## 2023-02-03 DIAGNOSIS — O26899 Other specified pregnancy related conditions, unspecified trimester: Principal | ICD-10-CM | POA: Diagnosis present

## 2023-02-03 DIAGNOSIS — F17200 Nicotine dependence, unspecified, uncomplicated: Secondary | ICD-10-CM | POA: Insufficient documentation

## 2023-02-03 DIAGNOSIS — R109 Unspecified abdominal pain: Secondary | ICD-10-CM | POA: Diagnosis not present

## 2023-02-03 LAB — URINALYSIS, ROUTINE W REFLEX MICROSCOPIC
Bilirubin Urine: NEGATIVE
Glucose, UA: NEGATIVE mg/dL
Hgb urine dipstick: NEGATIVE
Ketones, ur: NEGATIVE mg/dL
Leukocytes,Ua: NEGATIVE
Nitrite: NEGATIVE
Protein, ur: NEGATIVE mg/dL
Specific Gravity, Urine: 1.011 (ref 1.005–1.030)
pH: 7 (ref 5.0–8.0)

## 2023-02-03 LAB — WET PREP, GENITAL
Clue Cells Wet Prep HPF POC: NONE SEEN
Sperm: NONE SEEN
Trich, Wet Prep: NONE SEEN
WBC, Wet Prep HPF POC: <10 (ref <10)
Yeast Wet Prep HPF POC: NONE SEEN

## 2023-02-03 MED ORDER — ACETAMINOPHEN 500 MG PO TABS: 1000.0000 mg | ORAL_TABLET | Freq: Four times a day (QID) | ORAL | Status: DC | PRN

## 2023-02-03 MED ORDER — ACETAMINOPHEN 500 MG PO TABS
ORAL_TABLET | ORAL | Status: AC
  Administered 2023-02-03: 1000 mg via ORAL
  Filled 2023-02-03: qty 2

## 2023-02-03 NOTE — OB Triage Note (Signed)
Discharge home. Left floor ambulatory by self. Elaina Hoops

## 2023-02-03 NOTE — OB Triage Note (Signed)
Pt co abdominal cramping x 1-2 hours. Denies LOF, vaginal bleeding. + fetal movement. Denies recent sex. Elaina Hoops

## 2023-02-03 NOTE — Discharge Summary (Signed)
Patient ID: Paula Keller MRN: [id number] DOB/AGE: 1990-11-24 32 y.o.  Admit date: 02/03/2023 Discharge date: 02/03/2023  Admission Diagnoses: 32yo G3P2 at [redacted]w[redacted]d present with abdominal cramping for about 1-2 hours. Denies VB, LOF, or contractions.  Reports good fetal movement.  Denies recent IC.  Denies right epigastric pain, vision changes, or  HA.  Discharge Diagnoses: Improved cramping with minor back pain  Factors complicating pregnancy: Elevated 1hr GTT H/o mental health diagnoses: anxiety, depression and bipolar Chronic HTN  Obesity BMI:44 Tobacco use in pregnancy  Marijuana use in pregnancy   Prenatal Procedures: none  Consults: None  Significant Diagnostic Studies:  Results for orders placed or performed during the hospital encounter of 02/03/23 (from the past 168 hour(s))  Wet prep, genital   Collection Time: 02/03/23  3:37 PM   Specimen: Vaginal  Result Value Ref Range   Yeast Wet Prep HPF POC NONE SEEN NONE SEEN   Trich, Wet Prep NONE SEEN NONE SEEN   Clue Cells Wet Prep HPF POC NONE SEEN NONE SEEN   WBC, Wet Prep HPF POC <10 <10   Sperm NONE SEEN   Urinalysis, Routine w reflex microscopic -Urine, Clean Catch   Collection Time: 02/03/23  3:37 PM  Result Value Ref Range   Color, Urine YELLOW (A) YELLOW   APPearance HAZY (A) CLEAR   Specific Gravity, Urine 1.011 1.005 - 1.030   pH 7.0 5.0 - 8.0   Glucose, UA NEGATIVE NEGATIVE mg/dL   Hgb urine dipstick NEGATIVE NEGATIVE   Bilirubin Urine NEGATIVE NEGATIVE   Ketones, ur NEGATIVE NEGATIVE mg/dL   Protein, ur NEGATIVE NEGATIVE mg/dL   Nitrite NEGATIVE NEGATIVE   Leukocytes,Ua NEGATIVE NEGATIVE    Treatments: none  Hospital Course:  This is a 32 y.o. [id number] with IUP at [redacted]w[redacted]d seen for abdominal cramping.  No leaking of fluid and no bleeding.  Labs were collected - results noted above.  She was given Tylenol - with relief. She was observed, fetal heart rate monitoring remained reassuring, and she had no  signs/symptoms of preterm labor or other maternal-fetal concerns. She was deemed stable for discharge to home with outpatient follow up.  Discharge Physical Exam:  BP (!) 156/79 (BP Location: Left Arm)   Pulse (!) 106   Temp 97.7 F (36.5 C) (Oral)   Resp 20   Ht 5\' 4"  (1.626 m)   Wt 116.1 kg   BMI 43.94 kg/m   General: NAD CV: RRR Pulm: CTABL, nl effort ABD: s/nd/nt, gravid DVT Evaluation: LE non-ttp, no evidence of DVT on exam.  FHT appropriate for GA  TOCO: quiet SVE: deferred      Discharge Condition: Stable  Disposition: Discharge disposition: 01-Home or Self Care        Allergies as of 02/03/2023       Reactions   Amoxicillin Rash        Medication List     STOP taking these medications    albuterol 108 (90 Base) MCG/ACT inhaler Commonly known as: VENTOLIN HFA   hydrOXYzine 10 MG tablet Commonly known as: ATARAX       TAKE these medications    aspirin 81 MG chewable tablet Chew 81 mg by mouth daily.   famotidine 20 MG tablet Commonly known as: PEPCID Take 1 tablet (20 mg total) by mouth daily.   metoCLOPramide 10 MG tablet Commonly known as: REGLAN Take 1 tablet (10 mg total) by mouth every 8 (eight) hours as needed for nausea or vomiting.  NIFEdipine 60 MG 24 hr tablet Commonly known as: PROCARDIA XL/NIFEDICAL XL Take 60 mg by mouth daily.   NON FORMULARY Take 1 tablet by mouth daily. Pt taking Magnesium for headaches and leg cramps.  Unsure of dosage.   prenatal multivitamin Tabs tablet Take 1 tablet by mouth daily at 12 noon.         SignedHaroldine Laws, CNM 02/03/2023 6:07 PM

## 2023-02-07 ENCOUNTER — Ambulatory Visit (INDEPENDENT_AMBULATORY_CARE_PROVIDER_SITE_OTHER): Payer: Medicaid Other | Admitting: Advanced Practice Midwife

## 2023-02-07 ENCOUNTER — Encounter: Payer: Self-pay | Admitting: Advanced Practice Midwife

## 2023-02-07 VITALS — BP 124/77 | HR 99 | Wt 265.5 lb

## 2023-02-07 DIAGNOSIS — Z369 Encounter for antenatal screening, unspecified: Secondary | ICD-10-CM

## 2023-02-07 DIAGNOSIS — Z3A26 26 weeks gestation of pregnancy: Secondary | ICD-10-CM

## 2023-02-07 DIAGNOSIS — Z3482 Encounter for supervision of other normal pregnancy, second trimester: Secondary | ICD-10-CM | POA: Diagnosis not present

## 2023-02-07 DIAGNOSIS — O99210 Obesity complicating pregnancy, unspecified trimester: Secondary | ICD-10-CM | POA: Insufficient documentation

## 2023-02-07 DIAGNOSIS — Z113 Encounter for screening for infections with a predominantly sexual mode of transmission: Secondary | ICD-10-CM

## 2023-02-07 DIAGNOSIS — O99212 Obesity complicating pregnancy, second trimester: Secondary | ICD-10-CM

## 2023-02-07 DIAGNOSIS — O0992 Supervision of high risk pregnancy, unspecified, second trimester: Secondary | ICD-10-CM

## 2023-02-07 DIAGNOSIS — O0990 Supervision of high risk pregnancy, unspecified, unspecified trimester: Secondary | ICD-10-CM | POA: Insufficient documentation

## 2023-02-07 DIAGNOSIS — O10919 Unspecified pre-existing hypertension complicating pregnancy, unspecified trimester: Secondary | ICD-10-CM

## 2023-02-07 DIAGNOSIS — O219 Vomiting of pregnancy, unspecified: Secondary | ICD-10-CM

## 2023-02-07 DIAGNOSIS — Z131 Encounter for screening for diabetes mellitus: Secondary | ICD-10-CM

## 2023-02-07 DIAGNOSIS — Z130 Encounter for screening for diseases of the blood and blood-forming organs and certain disorders involving the immune mechanism: Secondary | ICD-10-CM

## 2023-02-07 MED ORDER — ONDANSETRON 4 MG PO TBDP
4.0000 mg | ORAL_TABLET | Freq: Four times a day (QID) | ORAL | 1 refills | Status: AC | PRN
Start: 2023-02-07 — End: ?

## 2023-02-07 NOTE — Progress Notes (Signed)
[facility]  New Obstetric Patient H&P  NOB transfer at 26 weeks  Chief Complaint: "Desires prenatal care"   History of Present Illness: Patient is a 32 y.o. [id number] Not Hispanic or Latino female, presents with amenorrhea and positive home pregnancy test. No LMP recorded. Patient is pregnant. and based on 6w ultrasound, her EDD is Estimated Date of Delivery: 05/16/23 and her EGA is [redacted]w[redacted]d. Cycles are 5 days, regular, and occur approximately every : 28 days. Her last pap smear was 3 months ago and was no abnormalities.    Her last menstrual period was normal and lasted for  5 day(s). Since her LMP she claims she has experienced fatigue, nausea, vomiting. She denies vaginal bleeding. Her past medical history is contributory for chronic hypertension, obesity, bipolar, anxiety, marijuana use. Her prior pregnancies are notable for  gestational hypertension with G2, 2 full term SVDs.   Since her LMP, she admits to the use of tobacco products  yes, 5 c/d She claims she has gained   13  pounds since the start of her pregnancy.  There are cats in the home in the home  yes partner takes care of litter box She admits close contact with children on a regular basis  yes  She has had chicken pox in the past yes She has had Tuberculosis exposures, symptoms, or previously tested positive for TB   no Current or past history of domestic violence. no  Genetic Screening/Teratology Counseling: (Includes patient, baby's father, or anyone in either family with:)   1. Patient's age >/= 65 at [facility]  no 2. Thalassemia ([place], [place], Mediterranean, or Asian background): MCV<80  no 3. Neural tube defect (meningomyelocele, spina bifida, anencephaly)  no 4. Congenital heart defect  no  5. Down syndrome  no 6. Tay-Sachs (Jewish, [place]  no 7. Canavan's Disease  no 8. Sickle cell disease or trait (African)  no  9. Hemophilia or other blood disorders  no  10. Muscular dystrophy  no  11. Cystic fibrosis   no  12. Huntington's Chorea  no  13. Mental retardation/autism  no 14. Other inherited genetic or chromosomal disorder  no 15. Maternal metabolic disorder (DM, PKU, etc)  no 16. Patient or FOB with a child with a birth defect not listed above no  16a. Patient or FOB with a birth defect themselves no 17. Recurrent pregnancy loss, or stillbirth  no  18. Any medications since LMP other than prenatal vitamins (include vitamins, supplements, OTC meds, drugs, alcohol)  Procardia 60 mg XL, Marijuana, Reglan 19. Any other genetic/environmental exposure to discuss  no  Infection History:   1. Lives with someone with TB or TB exposed  no  2. Patient or partner has history of genital herpes  no 3. Rash or viral illness since LMP  no 4. History of STI (GC, CT, HPV, syphilis, HIV)  no 5. History of recent travel :  no  Other pertinent information:  no     Review of Systems:10 point review of systems negative unless otherwise noted in HPI  Past Medical History:  Patient Active Problem List   Diagnosis Date Noted Date Diagnosed   Supervision of high-risk pregnancy 02/07/2023      Nursing Staff Provider  Office Location [facility] Dating  By 6w u/s  Language  English Anatomy US  By MFM, complete, placenta posterior  Flu Vaccine   Genetic Screen  NIPS: negative, female  TDaP vaccine    Hgb A1C or  GTT  Early : 170, Hgb A1c 5.8, passed 3h Third trimester : 3hgtt  Covid    LAB RESULTS   Rhogam   Blood Type --/--/A POS Performed at [facility], [address] [place], [place] [address]  (06/28 2015)   Feeding Plan Plans breast Antibody negative  Contraception  Rubella immune  Circumcision  RPR Non-reactive   Pediatrician   HBsAg negative   Support Person Chris HIV negative  Prenatal Classes  Varicella immune    GBS  (For PCN allergy, check sensitivities)   BTL Consent     VBAC Consent NA Pap Negative on 10/18/22    Hgb Electro    Pelvis Tested  CF      SMA          [ ]   Aspirin 81 mg daily after 12 weeks; discontinue after 36 weeks [ ]  baseline labs with CBC, CMP, urine protein/creatinine ratio [ ]  no BP meds unless BPs become elevated [ ]  ultrasound for growth at 28, 32, 36 weeks [ ]  Aspirin 81 mg daily after 12 weeks; discontinue after 36 weeks [ ]  Baseline EKG   Current antihypertensives:  Procardia XL 60 mg  Baseline and surveillance labs (pulled in from [facility], refresh links as needed)  Lab Results  Component Value Date   PLT 440 (H) 12/20/2022   CREATININE 0.66 12/20/2022   AST 24 12/20/2022   ALT 35 12/20/2022    Antenatal Testing CHTN - O10.919  Group I  BP < 140/90, no preeclampsia, AGA,  nml AFV, +/- meds    Group II BP > 140/90, on meds, no preeclampsia, AGA, nml AFV  20-28-34-38  20-24-28-32-35-38  32//2 x wk  28//BPP wkly then 32//2 x wk  40 no meds; 39 meds  PRN or 37  Pre-eclampsia  GHTN - O13.9/Preeclampsia without severe features  - O14.00   Preeclampsia with severe features - O14.10  Q 3-4wks  Q 2 wks  28//BPP wkly then 32//2 x wk  Inpatient  37  PRN or 34       Obesity affecting pregnancy 02/07/2023    Abdominal cramping affecting pregnancy 02/03/2023    Nicotine dependence 10/18/2022    Marijuana use during pregnancy 10/18/2022    Anxiety 10/18/2022    PTSD (post-traumatic stress disorder) 06/19/2018    OCD (obsessive compulsive disorder) 06/19/2018    Bipolar I disorder, current or most recent episode depressed, with psychotic features (HCC) 06/18/2018    Tobacco use disorder 06/18/2018    Cannabis use disorder, moderate, dependence (HCC) 06/18/2018    Chronic hypertension affecting pregnancy 06/18/2018     Past Surgical History:  Past Surgical History:  Procedure Laterality Date   TONSILLECTOMY      Gynecologic History: No LMP recorded. Patient is pregnant.  Obstetric History: [id number]  Family History:  Family History  Adopted: Yes    Social History:  Social History   Socioeconomic  History   Marital status: Single    Spouse name: Thayer Ohm   Number of children: Not on file   Years of education: Not on file   Highest education level: Not on file  Occupational History   Occupation: Hair Stylist  Tobacco Use   Smoking status: Every Day    Current packs/day: 0.25    Types: Cigarettes   Smokeless tobacco: Never  Vaping Use   Vaping status: Never Used  Substance and Sexual Activity   Alcohol use: No   Drug use: Yes    Types: Marijuana   Sexual  activity: Yes    Birth control/protection: Surgical  Other Topics Concern   Not on file  Social History Narrative   Not on file   Social Determinants of Health   Financial Resource Strain: Not on file  Food Insecurity: Not on file  Transportation Needs: Not on file  Physical Activity: Not on file  Stress: Not on file  Social Connections: Not on file  Intimate Partner Violence: Not on file    Allergies:  Allergies  Allergen Reactions   Amoxicillin Rash    Medications: Prior to Admission medications   Medication Sig Start Date End Date Taking? Authorizing Provider  aspirin 81 MG chewable tablet Chew 81 mg by mouth daily.   Yes [provider]  NIFEdipine (PROCARDIA XL/NIFEDICAL XL) 60 MG 24 hr tablet Take 60 mg by mouth daily.   Yes [provider]  NON FORMULARY Take 1 tablet by mouth daily. Pt taking Magnesium for headaches and leg cramps.  Unsure of dosage.   Yes [provider]  ondansetron (ZOFRAN-ODT) 4 MG disintegrating tablet Take 1 tablet (4 mg total) by mouth every 6 (six) hours as needed for nausea. 02/07/23  Yes Tresea Mall, CNM  Prenatal Vit-Fe Fumarate-FA (PRENATAL MULTIVITAMIN) TABS tablet Take 1 tablet by mouth daily at 12 noon.   Yes [provider]  famotidine (PEPCID) 20 MG tablet Take 1 tablet (20 mg total) by mouth daily. 12/20/22 01/19/23  Corena Herter, MD  magnesium oxide (MAG-OX) 400 MG tablet Take by mouth.    [provider]  metoCLOPramide  (REGLAN) 10 MG tablet Take 1 tablet (10 mg total) by mouth every 8 (eight) hours as needed for nausea or vomiting. 12/20/22 01/19/23  Corena Herter, MD  promethazine (PHENERGAN) 25 MG tablet Take 25 mg by mouth every 6 (six) hours as needed.    [provider]    Physical Exam Vitals: Blood pressure 124/77, pulse 99, weight 265 lb 8 oz (120.4 kg).  General: NAD HEENT: normocephalic, anicteric Thyroid: no enlargement, no palpable nodules Pulmonary: No increased work of breathing, CTAB Cardiovascular: RRR, distal pulses 2+ Abdomen: NABS, soft, non-tender, non-distended.  Umbilicus without lesions.  No hepatomegaly, splenomegaly or masses palpable. No evidence of hernia  Genitourinary: deferred for advanced gestational age, no concerns/PAP interval Extremities: no edema, erythema, or tenderness Neurologic: Grossly intact Psychiatric: mood appropriate, affect full   The following were addressed during this visit:  Breastfeeding Education - Early initiation of breastfeeding    Comments: Keeps milk supply adequate, helps contract uterus and slow bleeding, and early milk is the perfect first food and is easy to digest.   - The importance of exclusive breastfeeding    Comments: Provides antibodies, Lower risk of breast and ovarian cancers, and type-2 diabetes,Helps your body recover, Reduced chance of SIDS.   - Risks of giving your baby anything other than breast milk if you are breastfeeding    Comments: Make the baby less content with breastfeeds, may make my baby more susceptible to illness, and may reduce my milk supply.   - The importance of early skin-to-skin contact    Comments:  Keeps baby warm and secure, helps keep baby's blood sugar up and breathing steady, easier to bond and breastfeed, and helps calm baby.  - Rooming-in on a 24-hour basis    Comments: Easier to learn baby's feeding cues, easier to bond and get to know each other, and encourages milk  production.   - Feeding on demand or baby-led feeding  Comments: Helps prevent breastfeeding complications, helps bring in good milk supply, prevents under or overfeeding, and helps baby feel content and satisfied   - Frequent feeding to help assure optimal milk production    Comments: Making a full supply of milk requires frequent removal of milk from breasts, infant will eat 8-12 times in 24 hours, if separated from infant use breast massage, hand expression and/ or pumping to remove milk from breasts.   - Effective positioning and attachment    Comments: Helps my baby to get enough breast milk, helps to produce an adequate milk supply, and helps prevent nipple pain and damage   - Exclusive breastfeeding for the first 6 months    Comments: Builds a healthy milk supply and keeps it up, protects baby from sickness and disease, and breastmilk has everything your baby needs for the first 6 months.  - Individualized Education    Comments: Contraindications to breastfeeding and other special medical conditions She breast fed her other two babies for 1 month each     Assessment: 89 y.o. G3P2002 at [redacted]w[redacted]d presenting to initiate prenatal care  Plan: 1) Avoid alcoholic beverages. 2) Patient encouraged not to smoke.  3) Discontinue the use of all non-medicinal drugs and chemicals.  4) Take prenatal vitamins daily.  5) Nutrition, food safety (fish, cheese advisories, and high nitrite foods) and exercise discussed. 6) Hospital and practice style discussed with cross coverage system.  7) Genetic Screening, such as with 1st Trimester Screening, cell free fetal DNA, AFP testing, and Ultrasound, as well as with amniocentesis and CVS as appropriate, is discussed with patient. At the conclusion of today's visit patient requested genetic testing. Previously done. 8) Patient is asked about travel to areas at risk for the Zika virus, and counseled to avoid travel and exposure to mosquitoes or sexual  partners who may have themselves been exposed to the virus. Testing is discussed, and will be ordered as appropriate.  9) NOB visit today 10) Return to clinic in 2 weeks for 3h gtt, other 28w labs and ROB 11) Follow up with MFM as scheduled 12) cHTN: baby ASA, Procardia 60 mg XL daily   Tresea Mall, CNM [facility] 02/07/2023 12:18 PM

## 2023-02-09 ENCOUNTER — Observation Stay
Admission: EM | Admit: 2023-02-09 | Discharge: 2023-02-09 | Disposition: A | Discharge status: Home / Self Care | Payer: Medicaid Other | Attending: Family | Admitting: Family

## 2023-02-09 ENCOUNTER — Encounter: Payer: Self-pay | Admitting: Obstetrics and Gynecology

## 2023-02-09 ENCOUNTER — Telehealth: Payer: Self-pay

## 2023-02-09 ENCOUNTER — Other Ambulatory Visit: Payer: Self-pay

## 2023-02-09 DIAGNOSIS — F17210 Nicotine dependence, cigarettes, uncomplicated: Secondary | ICD-10-CM | POA: Insufficient documentation

## 2023-02-09 DIAGNOSIS — O99512 Diseases of the respiratory system complicating pregnancy, second trimester: Secondary | ICD-10-CM | POA: Insufficient documentation

## 2023-02-09 DIAGNOSIS — O212 Late vomiting of pregnancy: Principal | ICD-10-CM | POA: Insufficient documentation

## 2023-02-09 DIAGNOSIS — Z3A26 26 weeks gestation of pregnancy: Secondary | ICD-10-CM | POA: Diagnosis not present

## 2023-02-09 DIAGNOSIS — O0992 Supervision of high risk pregnancy, unspecified, second trimester: Secondary | ICD-10-CM

## 2023-02-09 DIAGNOSIS — O10012 Pre-existing essential hypertension complicating pregnancy, second trimester: Secondary | ICD-10-CM | POA: Diagnosis not present

## 2023-02-09 DIAGNOSIS — O219 Vomiting of pregnancy, unspecified: Secondary | ICD-10-CM

## 2023-02-09 DIAGNOSIS — O99332 Smoking (tobacco) complicating pregnancy, second trimester: Secondary | ICD-10-CM | POA: Insufficient documentation

## 2023-02-09 DIAGNOSIS — J45909 Unspecified asthma, uncomplicated: Secondary | ICD-10-CM | POA: Diagnosis not present

## 2023-02-09 DIAGNOSIS — Z79899 Other long term (current) drug therapy: Secondary | ICD-10-CM | POA: Insufficient documentation

## 2023-02-09 DIAGNOSIS — Z7982 Long term (current) use of aspirin: Secondary | ICD-10-CM | POA: Insufficient documentation

## 2023-02-09 DIAGNOSIS — O10919 Unspecified pre-existing hypertension complicating pregnancy, unspecified trimester: Principal | ICD-10-CM

## 2023-02-09 DIAGNOSIS — R1110 Vomiting, unspecified: Secondary | ICD-10-CM | POA: Diagnosis present

## 2023-02-09 LAB — COMPREHENSIVE METABOLIC PANEL
ALT: 15 U/L (ref 0–44)
AST: 16 U/L (ref 15–41)
Albumin: 3.2 g/dL — ABNORMAL LOW (ref 3.5–5.0)
Alkaline Phosphatase: 139 U/L — ABNORMAL HIGH (ref 38–126)
Anion gap: 11 (ref 5–15)
BUN: 6 mg/dL (ref 6–20)
CO2: 23 mmol/L (ref 22–32)
Calcium: 9.3 mg/dL (ref 8.9–10.3)
Chloride: 104 mmol/L (ref 98–111)
Creatinine, Ser: 0.58 mg/dL (ref 0.44–1.00)
GFR, Estimated: >60 mL/min (ref >60)
Glucose, Bld: 94 mg/dL (ref 70–99)
Potassium: 3.1 mmol/L — ABNORMAL LOW (ref 3.5–5.1)
Sodium: 138 mmol/L (ref 135–145)
Total Bilirubin: 0.5 mg/dL (ref 0.3–1.2)
Total Protein: 6.8 g/dL (ref 6.5–8.1)

## 2023-02-09 LAB — URINALYSIS, ROUTINE W REFLEX MICROSCOPIC
Bilirubin Urine: NEGATIVE
Glucose, UA: NEGATIVE mg/dL
Hgb urine dipstick: NEGATIVE
Ketones, ur: 20 mg/dL — AB
Leukocytes,Ua: NEGATIVE
Nitrite: NEGATIVE
Protein, ur: 30 mg/dL — AB
Specific Gravity, Urine: 1.023 (ref 1.005–1.030)
pH: 5 (ref 5.0–8.0)

## 2023-02-09 MED ORDER — ONDANSETRON 4 MG PO TBDP: 4.0000 mg | ORAL_TABLET | Freq: Three times a day (TID) | ORAL | Status: DC | PRN

## 2023-02-09 MED ORDER — HYDROXYZINE HCL 50 MG/ML IM SOLN: 50.0000 mg | Freq: Four times a day (QID) | INTRAMUSCULAR | Status: DC | PRN

## 2023-02-09 MED ORDER — HYDROXYZINE HCL 25 MG PO TABS: 50.0000 mg | ORAL_TABLET | Freq: Four times a day (QID) | ORAL | Status: DC | PRN

## 2023-02-09 MED ORDER — CALCIUM CARBONATE ANTACID 500 MG PO CHEW: 2.0000 | CHEWABLE_TABLET | ORAL | Status: DC | PRN

## 2023-02-09 MED ORDER — LACTATED RINGERS IV SOLN: INTRAVENOUS | Status: DC

## 2023-02-09 MED ORDER — LACTATED RINGERS IV BOLUS
1000.0000 mL | INTRAVENOUS | Status: AC
  Administered 2023-02-09: 1000 mL via INTRAVENOUS

## 2023-02-09 MED ORDER — ONDANSETRON 4 MG PO TBDP: 4.0000 mg | ORAL_TABLET | Freq: Three times a day (TID) | ORAL | 1 refills | Status: DC | PRN

## 2023-02-09 MED ORDER — PROMETHAZINE HCL 25 MG PO TABS: 12.5000 mg | ORAL_TABLET | ORAL | Status: DC | PRN

## 2023-02-09 MED ORDER — SODIUM CHLORIDE 0.9 % IV SOLN: 8.0000 mg | Freq: Three times a day (TID) | INTRAVENOUS | Status: DC | PRN

## 2023-02-09 MED ORDER — ONDANSETRON HCL 4 MG/2ML IJ SOLN
4.0000 mg | Freq: Three times a day (TID) | INTRAMUSCULAR | Status: DC | PRN
  Administered 2023-02-09: 4 mg via INTRAVENOUS
  Filled 2023-02-09: qty 2

## 2023-02-09 MED ORDER — ACETAMINOPHEN 325 MG PO TABS: 650.0000 mg | ORAL_TABLET | ORAL | Status: DC | PRN

## 2023-02-09 MED ORDER — PROMETHAZINE HCL 25 MG RE SUPP: 12.5000 mg | RECTAL | Status: DC | PRN
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
## 2023-02-09 NOTE — OB Triage Note (Signed)
Discharge instructions, labor precautions, and follow-up care reviewed with patient. All questions answered. Patient verbalized understanding. Discharged ambulatory off unit.

## 2023-02-09 NOTE — OB Triage Note (Signed)
Patient is a G3P2002 at [redacted]w[redacted]d when presents to unit c/o nausea/vomiting for the past 24 hours, has not been able to keep anything down. Reports right, upper abdominal pain. Reports +FM, denies vaginal bleeding and LOF. External monitors applied and assessing. Initial FHT 155.

## 2023-02-09 NOTE — Telephone Encounter (Signed)
Pt calling; has been throwing up ever since her visit with [place] Tuesday; is not even keeping water down; feels bad.  [phone]  Pt states it has been over 24 hours since she has kept any fluids down.  Adv pt to go to ER for fluids; not sure if ER will keep her or send her to L&D; Paula Keller notified just in case.

## 2023-02-13 ENCOUNTER — Other Ambulatory Visit: Payer: Self-pay | Admitting: Obstetrics

## 2023-02-13 DIAGNOSIS — O99212 Obesity complicating pregnancy, second trimester: Secondary | ICD-10-CM

## 2023-02-13 DIAGNOSIS — O99213 Obesity complicating pregnancy, third trimester: Secondary | ICD-10-CM

## 2023-02-18 ENCOUNTER — Other Ambulatory Visit: Payer: Self-pay | Admitting: Licensed Practical Nurse

## 2023-02-18 DIAGNOSIS — Z6841 Body Mass Index (BMI) 40.0 and over, adult: Secondary | ICD-10-CM

## 2023-02-18 DIAGNOSIS — O0993 Supervision of high risk pregnancy, unspecified, third trimester: Secondary | ICD-10-CM

## 2023-02-18 NOTE — Progress Notes (Signed)
Anesthesia referral for high BMI placed  Paula Keller, [place]  [facility]  02/18/23  4:30 PM

## 2023-02-20 ENCOUNTER — Ambulatory Visit: Payer: Medicaid Other | Attending: Maternal & Fetal Medicine

## 2023-02-20 ENCOUNTER — Other Ambulatory Visit: Payer: Self-pay

## 2023-02-20 DIAGNOSIS — Z3A27 27 weeks gestation of pregnancy: Secondary | ICD-10-CM

## 2023-02-20 DIAGNOSIS — D259 Leiomyoma of uterus, unspecified: Secondary | ICD-10-CM

## 2023-02-20 DIAGNOSIS — O99332 Smoking (tobacco) complicating pregnancy, second trimester: Secondary | ICD-10-CM

## 2023-02-20 DIAGNOSIS — Z362 Encounter for other antenatal screening follow-up: Secondary | ICD-10-CM | POA: Insufficient documentation

## 2023-02-20 DIAGNOSIS — F17210 Nicotine dependence, cigarettes, uncomplicated: Secondary | ICD-10-CM

## 2023-02-20 DIAGNOSIS — O99212 Obesity complicating pregnancy, second trimester: Secondary | ICD-10-CM | POA: Diagnosis present

## 2023-02-20 DIAGNOSIS — O10012 Pre-existing essential hypertension complicating pregnancy, second trimester: Secondary | ICD-10-CM | POA: Diagnosis not present

## 2023-02-20 DIAGNOSIS — O3412 Maternal care for benign tumor of corpus uteri, second trimester: Secondary | ICD-10-CM | POA: Diagnosis not present

## 2023-02-20 DIAGNOSIS — E669 Obesity, unspecified: Secondary | ICD-10-CM

## 2023-02-21 ENCOUNTER — Other Ambulatory Visit: Payer: Medicaid Other

## 2023-02-21 ENCOUNTER — Ambulatory Visit (INDEPENDENT_AMBULATORY_CARE_PROVIDER_SITE_OTHER): Payer: Medicaid Other | Admitting: Obstetrics and Gynecology

## 2023-02-21 ENCOUNTER — Encounter: Payer: Self-pay | Admitting: Obstetrics and Gynecology

## 2023-02-21 VITALS — BP 143/93 | HR 90 | Wt 267.0 lb

## 2023-02-21 DIAGNOSIS — Z369 Encounter for antenatal screening, unspecified: Secondary | ICD-10-CM

## 2023-02-21 DIAGNOSIS — O99213 Obesity complicating pregnancy, third trimester: Secondary | ICD-10-CM

## 2023-02-21 DIAGNOSIS — O10013 Pre-existing essential hypertension complicating pregnancy, third trimester: Secondary | ICD-10-CM

## 2023-02-21 DIAGNOSIS — O10919 Unspecified pre-existing hypertension complicating pregnancy, unspecified trimester: Secondary | ICD-10-CM

## 2023-02-21 DIAGNOSIS — E669 Obesity, unspecified: Secondary | ICD-10-CM

## 2023-02-21 DIAGNOSIS — Z23 Encounter for immunization: Secondary | ICD-10-CM | POA: Diagnosis not present

## 2023-02-21 DIAGNOSIS — O99212 Obesity complicating pregnancy, second trimester: Secondary | ICD-10-CM

## 2023-02-21 DIAGNOSIS — O0993 Supervision of high risk pregnancy, unspecified, third trimester: Secondary | ICD-10-CM

## 2023-02-21 DIAGNOSIS — Z3A28 28 weeks gestation of pregnancy: Secondary | ICD-10-CM

## 2023-02-21 DIAGNOSIS — O0992 Supervision of high risk pregnancy, unspecified, second trimester: Secondary | ICD-10-CM

## 2023-02-21 DIAGNOSIS — Z131 Encounter for screening for diabetes mellitus: Secondary | ICD-10-CM

## 2023-02-21 LAB — POCT URINALYSIS DIPSTICK OB
Bilirubin, UA: NEGATIVE
Blood, UA: NEGATIVE
Glucose, UA: NEGATIVE
Ketones, UA: NEGATIVE
Leukocytes, UA: NEGATIVE
Nitrite, UA: NEGATIVE
Spec Grav, UA: 1.015 (ref 1.010–1.025)
Urobilinogen, UA: 0.2 U/dL
pH, UA: 5 (ref 5.0–8.0)

## 2023-02-21 MED ORDER — NIFEDIPINE ER OSMOTIC RELEASE 30 MG PO TB24
30.0000 mg | ORAL_TABLET | Freq: Every day | ORAL | 1 refills | Status: DC
Start: 2023-02-21 — End: 2023-03-12

## 2023-02-21 NOTE — Progress Notes (Signed)
ROB: She presents today at 28 weeks.  She is doing a 3-hour GTT today.  She reports daily fetal movement.  She does complain of swelling in her hands and feet and tingling mostly in her hands.  She also has some sciatica type symptoms.  We have discussed sciatica and carpal tunnel syndrome.  Neutral wrist splints recommended.  Patient's BMI now over 45.  Anesthesia consult previously ordered.  I have discussed this with her today because she has stated she was unaware of this..  Blood pressures have been elevated over the last few days, she reports taking her Procardia as directed-60 mg nightly.  I have now added 30 mg to the morning so that she will be taking total of 90/day.

## 2023-02-21 NOTE — Progress Notes (Addendum)
ROB. Patient states daily fetal movement along with back pain. Reports hands, arms and legs feeling like pins and needles; discussed wrist splints. Patient completing 3 hr GCT today, received TDAP injection and signed BTC today. Growth ultrasound with MFM yesterday.

## 2023-02-22 LAB — GESTATIONAL GLUCOSE TOLERANCE
Glucose, Fasting: 82 mg/dL (ref 70–94)
Glucose, GTT - 1 Hour: 194 mg/dL — ABNORMAL HIGH (ref 70–179)
Glucose, GTT - 2 Hour: 145 mg/dL (ref 70–154)
Glucose, GTT - 3 Hour: 81 mg/dL (ref 70–139)

## 2023-02-27 ENCOUNTER — Encounter
Admission: RE | Admit: 2023-02-27 | Discharge: 2023-02-27 | Disposition: A | Discharge status: Home / Self Care | Payer: Medicaid Other | Source: Ambulatory Visit | Attending: Anesthesiology | Admitting: Anesthesiology

## 2023-02-27 NOTE — Consult Note (Signed)
[facility] Anesthesia Consultation  Keshea Camaj Rath ZOX:[id number] DOB: 11-Dec-1990 DOA: 02/27/2023 PCP: [facility]   Requesting physician: Dr. Logan Bores Date of consultation: 02/27/2023 Reason for consultation: Obesity during pregnancy  CHIEF COMPLAINT:  Obesity during pregnancy  HISTORY OF PRESENT ILLNESS: Paula Keller  is a 32 y.o. female with a known history of pre-eclampsia and obesity presenting for pre-anesthetic evaluation prior to labor delivery at [facility].  PAST MEDICAL HISTORY:   Past Medical History:  Diagnosis Date   Anxiety    Depression    Hypertension     PAST SURGICAL HISTORY:  Past Surgical History:  Procedure Laterality Date   TONSILLECTOMY      SOCIAL HISTORY:  Social History   Tobacco Use   Smoking status: Every Day    Current packs/day: 0.25    Types: Cigarettes   Smokeless tobacco: Never  Substance Use Topics   Alcohol use: No    FAMILY HISTORY:  Family History  Adopted: Yes    DRUG ALLERGIES:  Allergies  Allergen Reactions   Amoxicillin Rash    REVIEW OF SYSTEMS:   RESPIRATORY: No cough, shortness of breath, wheezing.  CARDIOVASCULAR: No chest pain, orthopnea, edema.  HEMATOLOGY: No anemia, easy bruising or bleeding SKIN: No rash or lesion. NEUROLOGIC: No tingling, numbness, weakness.  PSYCHIATRY: No anxiety or depression.   MEDICATIONS AT HOME:  Prior to Admission medications   Medication Sig Start Date End Date Taking? Authorizing Provider  aspirin 81 MG chewable tablet Chew 81 mg by mouth daily.    [provider]  famotidine (PEPCID) 20 MG tablet Take 1 tablet (20 mg total) by mouth daily. 12/20/22 01/19/23  Corena Herter, MD  magnesium oxide (MAG-OX) 400 MG tablet Take by mouth.    [provider]  metoCLOPramide (REGLAN) 10 MG tablet Take 1 tablet (10 mg total) by mouth every 8 (eight) hours as needed for nausea or vomiting. 12/20/22 01/19/23  Corena Herter, MD  NIFEdipine (PROCARDIA XL) 30 MG 24 hr tablet Take 1 tablet (30 mg total) by mouth daily. Take this in the AM and your 60 in the PM 02/21/23   Linzie Collin, MD  NIFEdipine (PROCARDIA XL/NIFEDICAL XL) 60 MG 24 hr tablet Take 60 mg by mouth daily.    [provider]  NON FORMULARY Take 1 tablet by mouth daily. Pt taking Magnesium for headaches and leg cramps.  Unsure of dosage.    [provider]  ondansetron (ZOFRAN-ODT) 4 MG disintegrating tablet Take 1 tablet (4 mg total) by mouth every 6 (six) hours as needed for nausea. 02/07/23   Tresea Mall, CNM  ondansetron (ZOFRAN-ODT) 4 MG disintegrating tablet Take 1-2 tablets (4-8 mg total) by mouth every 8 (eight) hours as needed for nausea or vomiting. 02/09/23   Raeford Razor, CNM  Prenatal Vit-Fe Fumarate-FA (PRENATAL MULTIVITAMIN) TABS tablet Take 1 tablet by mouth daily at 12 noon.    [provider]  promethazine (PHENERGAN) 25 MG tablet Take 25 mg by mouth every 6 (six) hours as needed.    [provider]      PHYSICAL EXAMINATION:   VITAL SIGNS: There were no vitals taken for this visit.  GENERAL:  32 y.o.-year-old patient no acute distress.  HEENT: Head atraumatic, normocephalic. Oropharynx and nasopharynx clear. MP 2, TM distance >3 cm, normal mouth opening. LUNGS: No use of accessory muscles of respiration.   EXTREMITIES: No pedal edema, cyanosis, or clubbing.  NEUROLOGIC: normal gait PSYCHIATRIC: The patient  is alert and oriented x 3.  SKIN: No obvious rash, lesion, or ulcer.    IMPRESSION AND PLAN:   Paula Keller  is a 32 y.o. female presenting with obesity during pregnancy. BMI is currently 45 at [redacted] weeks gestation.   We discussed analgesic options during labor including epidural analgesia. Discussed that in obesity there can be increased difficulty with epidural placement or even failure of successful epidural. We also discussed that even after successful epidural  placement there is increased risk of catheter migration out of the epidural space that would require catheter replacement. Discussed use of epidural vs spinal vs GA if cesarean delivery is required. Discussed increased risk of difficult intubation during pregnancy should an emergency cesarean delivery be required.  She is good to deliver here at [facility] and we look forward to seeing her for her delivery at the end of December.  Thank you for this consult.

## 2023-03-01 ENCOUNTER — Encounter: Payer: Self-pay | Admitting: Obstetrics and Gynecology

## 2023-03-07 ENCOUNTER — Encounter: Payer: Self-pay | Admitting: Obstetrics

## 2023-03-07 ENCOUNTER — Encounter: Payer: Self-pay | Admitting: Obstetrics and Gynecology

## 2023-03-07 ENCOUNTER — Other Ambulatory Visit: Payer: Self-pay

## 2023-03-07 ENCOUNTER — Inpatient Hospital Stay
Admission: EM | Admit: 2023-03-07 | Discharge: 2023-03-12 | DRG: 784 | Disposition: A | Discharge status: Home / Self Care | Payer: Medicaid Other | Attending: Certified Nurse Midwife | Admitting: Certified Nurse Midwife

## 2023-03-07 ENCOUNTER — Inpatient Hospital Stay: Payer: Medicaid Other

## 2023-03-07 ENCOUNTER — Ambulatory Visit (INDEPENDENT_AMBULATORY_CARE_PROVIDER_SITE_OTHER): Payer: Medicaid Other | Admitting: Obstetrics and Gynecology

## 2023-03-07 VITALS — BP 185/116 | HR 112 | Wt 264.6 lb

## 2023-03-07 DIAGNOSIS — O9081 Anemia of the puerperium: Secondary | ICD-10-CM | POA: Diagnosis not present

## 2023-03-07 DIAGNOSIS — O99320 Drug use complicating pregnancy, unspecified trimester: Secondary | ICD-10-CM | POA: Diagnosis present

## 2023-03-07 DIAGNOSIS — Z302 Encounter for sterilization: Secondary | ICD-10-CM

## 2023-03-07 DIAGNOSIS — O119 Pre-existing hypertension with pre-eclampsia, unspecified trimester: Secondary | ICD-10-CM | POA: Diagnosis present

## 2023-03-07 DIAGNOSIS — O219 Vomiting of pregnancy, unspecified: Secondary | ICD-10-CM | POA: Diagnosis present

## 2023-03-07 DIAGNOSIS — O1425 HELLP syndrome, complicating the puerperium: Secondary | ICD-10-CM | POA: Diagnosis not present

## 2023-03-07 DIAGNOSIS — O1423 HELLP syndrome (HELLP), third trimester: Secondary | ICD-10-CM | POA: Diagnosis not present

## 2023-03-07 DIAGNOSIS — O99324 Drug use complicating childbirth: Secondary | ICD-10-CM | POA: Diagnosis present

## 2023-03-07 DIAGNOSIS — F319 Bipolar disorder, unspecified: Secondary | ICD-10-CM | POA: Diagnosis not present

## 2023-03-07 DIAGNOSIS — R112 Nausea with vomiting, unspecified: Secondary | ICD-10-CM

## 2023-03-07 DIAGNOSIS — R55 Syncope and collapse: Secondary | ICD-10-CM | POA: Diagnosis not present

## 2023-03-07 DIAGNOSIS — F17200 Nicotine dependence, unspecified, uncomplicated: Secondary | ICD-10-CM | POA: Diagnosis present

## 2023-03-07 DIAGNOSIS — O212 Late vomiting of pregnancy: Secondary | ICD-10-CM | POA: Diagnosis not present

## 2023-03-07 DIAGNOSIS — O9903 Anemia complicating the puerperium: Secondary | ICD-10-CM | POA: Diagnosis not present

## 2023-03-07 DIAGNOSIS — O99210 Obesity complicating pregnancy, unspecified trimester: Secondary | ICD-10-CM | POA: Diagnosis present

## 2023-03-07 DIAGNOSIS — O10013 Pre-existing essential hypertension complicating pregnancy, third trimester: Secondary | ICD-10-CM | POA: Diagnosis not present

## 2023-03-07 DIAGNOSIS — F17210 Nicotine dependence, cigarettes, uncomplicated: Secondary | ICD-10-CM | POA: Diagnosis present

## 2023-03-07 DIAGNOSIS — Z79899 Other long term (current) drug therapy: Secondary | ICD-10-CM | POA: Diagnosis not present

## 2023-03-07 DIAGNOSIS — O114 Pre-existing hypertension with pre-eclampsia, complicating childbirth: Secondary | ICD-10-CM | POA: Diagnosis present

## 2023-03-07 DIAGNOSIS — O99334 Smoking (tobacco) complicating childbirth: Secondary | ICD-10-CM | POA: Diagnosis present

## 2023-03-07 DIAGNOSIS — Z1152 Encounter for screening for COVID-19: Secondary | ICD-10-CM

## 2023-03-07 DIAGNOSIS — O99344 Other mental disorders complicating childbirth: Secondary | ICD-10-CM | POA: Diagnosis present

## 2023-03-07 DIAGNOSIS — O0990 Supervision of high risk pregnancy, unspecified, unspecified trimester: Secondary | ICD-10-CM

## 2023-03-07 DIAGNOSIS — Z880 Allergy status to penicillin: Secondary | ICD-10-CM

## 2023-03-07 DIAGNOSIS — Z3A30 30 weeks gestation of pregnancy: Secondary | ICD-10-CM

## 2023-03-07 DIAGNOSIS — O99213 Obesity complicating pregnancy, third trimester: Secondary | ICD-10-CM

## 2023-03-07 DIAGNOSIS — F419 Anxiety disorder, unspecified: Secondary | ICD-10-CM | POA: Diagnosis present

## 2023-03-07 DIAGNOSIS — Z7982 Long term (current) use of aspirin: Secondary | ICD-10-CM

## 2023-03-07 DIAGNOSIS — O113 Pre-existing hypertension with pre-eclampsia, third trimester: Secondary | ICD-10-CM | POA: Diagnosis not present

## 2023-03-07 DIAGNOSIS — Z23 Encounter for immunization: Secondary | ICD-10-CM | POA: Diagnosis not present

## 2023-03-07 DIAGNOSIS — Z98891 History of uterine scar from previous surgery: Principal | ICD-10-CM

## 2023-03-07 DIAGNOSIS — R030 Elevated blood-pressure reading, without diagnosis of hypertension: Secondary | ICD-10-CM | POA: Diagnosis present

## 2023-03-07 DIAGNOSIS — D62 Acute posthemorrhagic anemia: Secondary | ICD-10-CM | POA: Diagnosis not present

## 2023-03-07 DIAGNOSIS — O10919 Unspecified pre-existing hypertension complicating pregnancy, unspecified trimester: Principal | ICD-10-CM | POA: Diagnosis present

## 2023-03-07 DIAGNOSIS — O99345 Other mental disorders complicating the puerperium: Secondary | ICD-10-CM | POA: Diagnosis not present

## 2023-03-07 DIAGNOSIS — O0993 Supervision of high risk pregnancy, unspecified, third trimester: Secondary | ICD-10-CM

## 2023-03-07 DIAGNOSIS — O99214 Obesity complicating childbirth: Secondary | ICD-10-CM | POA: Diagnosis present

## 2023-03-07 DIAGNOSIS — F315 Bipolar disorder, current episode depressed, severe, with psychotic features: Secondary | ICD-10-CM | POA: Diagnosis present

## 2023-03-07 DIAGNOSIS — Z3202 Encounter for pregnancy test, result negative: Secondary | ICD-10-CM | POA: Diagnosis not present

## 2023-03-07 DIAGNOSIS — O1092 Unspecified pre-existing hypertension complicating childbirth: Secondary | ICD-10-CM | POA: Diagnosis present

## 2023-03-07 DIAGNOSIS — F1290 Cannabis use, unspecified, uncomplicated: Secondary | ICD-10-CM | POA: Diagnosis present

## 2023-03-07 DIAGNOSIS — E669 Obesity, unspecified: Secondary | ICD-10-CM | POA: Diagnosis not present

## 2023-03-07 HISTORY — DX: Unspecified asthma, uncomplicated: J45.909

## 2023-03-07 LAB — TYPE AND SCREEN
ABO/RH(D): A POS
Antibody Screen: NEGATIVE

## 2023-03-07 LAB — COMPREHENSIVE METABOLIC PANEL
ALT: 19 U/L (ref 0–44)
AST: 21 U/L (ref 15–41)
Albumin: 3.2 g/dL — ABNORMAL LOW (ref 3.5–5.0)
Alkaline Phosphatase: 158 U/L — ABNORMAL HIGH (ref 38–126)
Anion gap: 11 (ref 5–15)
BUN: 6 mg/dL (ref 6–20)
CO2: 22 mmol/L (ref 22–32)
Calcium: 9.2 mg/dL (ref 8.9–10.3)
Chloride: 104 mmol/L (ref 98–111)
Creatinine, Ser: 0.66 mg/dL (ref 0.44–1.00)
GFR, Estimated: >60 mL/min (ref >60)
Glucose, Bld: 106 mg/dL — ABNORMAL HIGH (ref 70–99)
Potassium: 3.6 mmol/L (ref 3.5–5.1)
Sodium: 137 mmol/L (ref 135–145)
Total Bilirubin: 0.4 mg/dL (ref 0.3–1.2)
Total Protein: 6.9 g/dL (ref 6.5–8.1)

## 2023-03-07 LAB — PROTEIN / CREATININE RATIO, URINE
Creatinine, Urine: 449 mg/dL
Protein Creatinine Ratio: 0.28 mg/mg{creat} — ABNORMAL HIGH (ref 0.00–0.15)
Total Protein, Urine: 125 mg/dL

## 2023-03-07 LAB — CBC
HCT: 34 % — ABNORMAL LOW (ref 36.0–46.0)
Hemoglobin: 11.3 g/dL — ABNORMAL LOW (ref 12.0–15.0)
MCH: 25.9 pg — ABNORMAL LOW (ref 26.0–34.0)
MCHC: 33.2 g/dL (ref 30.0–36.0)
MCV: 77.8 fL — ABNORMAL LOW (ref 80.0–100.0)
Platelets: 426 10*3/uL — ABNORMAL HIGH (ref 150–400)
RBC: 4.37 MIL/uL (ref 3.87–5.11)
RDW: 14 % (ref 11.5–15.5)
WBC: 14.2 10*3/uL — ABNORMAL HIGH (ref 4.0–10.5)
nRBC: 0 % (ref 0.0–0.2)

## 2023-03-07 LAB — POCT URINALYSIS DIPSTICK OB
Bilirubin, UA: NEGATIVE
Glucose, UA: NEGATIVE
Ketones, UA: POSITIVE
Leukocytes, UA: NEGATIVE
Nitrite, UA: NEGATIVE
Spec Grav, UA: 1.025 (ref 1.010–1.025)
Urobilinogen, UA: 0.2 U/dL
pH, UA: 6 (ref 5.0–8.0)

## 2023-03-07 MED ORDER — LABETALOL HCL 5 MG/ML IV SOLN: 40.0000 mg | INTRAVENOUS | Status: DC | PRN

## 2023-03-07 MED ORDER — ZOLPIDEM TARTRATE 5 MG PO TABS
5.0000 mg | ORAL_TABLET | Freq: Every evening | ORAL | Status: DC | PRN
  Administered 2023-03-07: 5 mg via ORAL
  Filled 2023-03-07: qty 1

## 2023-03-07 MED ORDER — ONDANSETRON HCL 4 MG/2ML IJ SOLN
4.0000 mg | Freq: Four times a day (QID) | INTRAMUSCULAR | Status: DC | PRN
  Administered 2023-03-07 – 2023-03-08 (×3): 4 mg via INTRAVENOUS
  Filled 2023-03-07 (×3): qty 2

## 2023-03-07 MED ORDER — NIFEDIPINE ER OSMOTIC RELEASE 30 MG PO TB24: 30.0000 mg | ORAL_TABLET | Freq: Every morning | ORAL | Status: DC

## 2023-03-07 MED ORDER — NIFEDIPINE ER OSMOTIC RELEASE 30 MG PO TB24: 60.0000 mg | ORAL_TABLET | Freq: Every evening | ORAL | Status: DC

## 2023-03-07 MED ORDER — HYDRALAZINE HCL 20 MG/ML IJ SOLN
10.0000 mg | INTRAMUSCULAR | Status: DC | PRN
  Administered 2023-03-08 (×2): 10 mg via INTRAVENOUS
  Filled 2023-03-07 (×2): qty 1

## 2023-03-07 MED ORDER — PRENATAL MULTIVITAMIN CH: 1.0000 | ORAL_TABLET | Freq: Every day | ORAL | Status: DC

## 2023-03-07 MED ORDER — ACETAMINOPHEN 325 MG PO TABS
650.0000 mg | ORAL_TABLET | ORAL | Status: DC | PRN
Start: 2023-03-07 — End: 2023-03-07
  Administered 2023-03-07: 650 mg via ORAL
  Filled 2023-03-07: qty 2

## 2023-03-07 MED ORDER — LABETALOL HCL 5 MG/ML IV SOLN
80.0000 mg | INTRAVENOUS | Status: DC | PRN
  Administered 2023-03-07: 80 mg via INTRAVENOUS
  Filled 2023-03-07 (×2): qty 16

## 2023-03-07 MED ORDER — HYDRALAZINE HCL 20 MG/ML IJ SOLN: 10.0000 mg | INTRAMUSCULAR | Status: DC | PRN

## 2023-03-07 MED ORDER — MAGNESIUM SULFATE BOLUS VIA INFUSION
4.0000 g | Freq: Once | INTRAVENOUS | Status: AC
  Administered 2023-03-07: 4 g via INTRAVENOUS
  Filled 2023-03-07: qty 1000

## 2023-03-07 MED ORDER — BETAMETHASONE SOD PHOS & ACET 6 (3-3) MG/ML IJ SUSP
12.0000 mg | INTRAMUSCULAR | Status: AC
  Administered 2023-03-08: 12 mg via INTRAMUSCULAR

## 2023-03-07 MED ORDER — CALCIUM CARBONATE ANTACID 500 MG PO CHEW
2.0000 | CHEWABLE_TABLET | ORAL | Status: DC | PRN
  Administered 2023-03-07: 400 mg via ORAL
  Filled 2023-03-07: qty 2

## 2023-03-07 MED ORDER — LABETALOL HCL 5 MG/ML IV SOLN
20.0000 mg | INTRAVENOUS | Status: DC | PRN
  Administered 2023-03-08: 20 mg via INTRAVENOUS
  Filled 2023-03-07: qty 4

## 2023-03-07 MED ORDER — ONDANSETRON 4 MG PO TBDP: 4.0000 mg | ORAL_TABLET | Freq: Three times a day (TID) | ORAL | 1 refills | Status: DC | PRN

## 2023-03-07 MED ORDER — HYDRALAZINE HCL 20 MG/ML IJ SOLN
5.0000 mg | INTRAMUSCULAR | Status: DC | PRN
  Administered 2023-03-08 – 2023-03-09 (×3): 5 mg via INTRAVENOUS
  Filled 2023-03-07 (×2): qty 1

## 2023-03-07 MED ORDER — MORPHINE SULFATE (PF) 2 MG/ML IV SOLN
2.0000 mg | Freq: Once | INTRAVENOUS | Status: AC
  Administered 2023-03-07: 2 mg via INTRAVENOUS
  Filled 2023-03-07: qty 1

## 2023-03-07 MED ORDER — LABETALOL HCL 5 MG/ML IV SOLN
20.0000 mg | INTRAVENOUS | Status: DC | PRN
  Administered 2023-03-07 (×2): 20 mg via INTRAVENOUS
  Filled 2023-03-07: qty 4

## 2023-03-07 MED ORDER — NIFEDIPINE ER OSMOTIC RELEASE 30 MG PO TB24
60.0000 mg | ORAL_TABLET | Freq: Every evening | ORAL | Status: DC
  Administered 2023-03-07 – 2023-03-08 (×2): 60 mg via ORAL
  Filled 2023-03-07 (×2): qty 2

## 2023-03-07 MED ORDER — MAGNESIUM SULFATE 40 GM/1000ML IV SOLN
INTRAVENOUS | Status: AC
  Administered 2023-03-07: 2 g/h via INTRAVENOUS
  Filled 2023-03-07: qty 1000

## 2023-03-07 MED ORDER — NIFEDIPINE ER OSMOTIC RELEASE 30 MG PO TB24
60.0000 mg | ORAL_TABLET | Freq: Every morning | ORAL | Status: DC
  Administered 2023-03-08 – 2023-03-09 (×2): 60 mg via ORAL
  Filled 2023-03-07 (×2): qty 2

## 2023-03-07 MED ORDER — LACTATED RINGERS IV SOLN: INTRAVENOUS | Status: DC

## 2023-03-07 MED ORDER — BETAMETHASONE SOD PHOS & ACET 6 (3-3) MG/ML IJ SUSP
INTRAMUSCULAR | Status: AC
  Administered 2023-03-07: 12 mg via INTRAMUSCULAR
  Filled 2023-03-07: qty 5

## 2023-03-07 MED ORDER — CALCIUM GLUCONATE 10 % IV SOLN
INTRAVENOUS | Status: AC
  Filled 2023-03-07: qty 10

## 2023-03-07 MED ORDER — LABETALOL HCL 5 MG/ML IV SOLN
40.0000 mg | INTRAVENOUS | Status: DC | PRN
  Administered 2023-03-07 (×2): 40 mg via INTRAVENOUS
  Filled 2023-03-07: qty 8

## 2023-03-07 MED ORDER — MAGNESIUM SULFATE 40 GM/1000ML IV SOLN
2.0000 g/h | INTRAVENOUS | Status: DC
  Administered 2023-03-08 – 2023-03-09 (×2): 2 g/h via INTRAVENOUS
  Filled 2023-03-07 (×2): qty 1000

## 2023-03-07 MED ORDER — LACTATED RINGERS IV SOLN: 125.0000 mL/h | INTRAVENOUS | Status: DC

## 2023-03-07 MED ORDER — ACETAMINOPHEN 500 MG PO TABS
1000.0000 mg | ORAL_TABLET | Freq: Four times a day (QID) | ORAL | Status: DC | PRN
  Administered 2023-03-08 – 2023-03-09 (×2): 1000 mg via ORAL
  Filled 2023-03-07 (×2): qty 2

## 2023-03-07 MED ORDER — PRENATAL MULTIVITAMIN CH
1.0000 | ORAL_TABLET | Freq: Every day | ORAL | Status: DC
  Administered 2023-03-08 – 2023-03-09 (×2): 1 via ORAL
  Filled 2023-03-07 (×2): qty 1

## 2023-03-07 NOTE — H&P (Signed)
[facility] Labor & Delivery  History and Physical  ASSESSMENT AND PLAN   Paula Keller is a 32 y.o. [id number] at [redacted]w[redacted]d with EDD: 05/16/2023, by Ultrasound admitted for observation in the setting of superimposed preeclampsia with severe features. Her pregnancy is complicated by chronic hypertension managed with medication (current regimen 30mg  morning/60 mg nightly nifedipine), obesity, bipolar disease, nicotine and marijuana use.    Superimposed Preeclampsia with Severe Features - Diagnosed by severe range blood pressures requiring IV blood pressure medication administration and proteinuria. Received 20 mg and 40 mg of IV labetalol while being observed in L&D triage. PIH labs WNL with the exception of UA with 3+ protein and UP:C of 0.28. Patient reviewed with Dr. Lonny Prude, to be admitted to L&D for ongoing observation and blood pressure management with the following plan:  - Betamethasone 12 mg now and in 24 hours.  - 24 hour protein urine collection - Magnesium sulfate for seizure prophylaxis/fetal neuro-protection, 4g loading/2g maintenance. - Blood pressure monitoring, labetalol pathway ordered for ongoing severe range blood pressures. - Antenatal fetal surveillance :BPP and continuous fetal heart rate monitoring. - MFM consult.  Fetal Status: - Category I fetal heart rate tracing (see NST note below), will continue to monitor  Labs/Immunizations: Prenatal labs and studies: ABO, Rh: --/--/A POS (10/22 1307) Antibody: NEG (10/22 1307) Rubella:  immune Varicella:   immune RPR:   non reactive HBsAg:   negative HepC:  negative HIV:   negative GBS:   unknown  TDAP: 02/21/23 Flu: no RSV: n/a (has not reached recommended gestational age)   HPI   Chief Complaint: Elevated blood pressures  Paula Keller is a 32 y.o. [phone] at [redacted]w[redacted]d was sent today from [facility] where she was being seen for a routine ROB visit and had several  severe range blood pressures. On presentation to L&D triage, her pressures oscillated between mild range and severe range with several requiring IV labetalol administration. She received a two doses of IV labetalol, 20 mg and 40 mg, prior to her pressures stabilizing in triage. She denies headache, changes in vision, RUQ pain. She endorses normal fetal movement. She was experiencing nausea and was initially unable to void.   PIH labs were collected and the patient was reviewed with Dr. Lonny Prude.  Pregnancy Complications Patient Active Problem List   Diagnosis Date Noted   Elevated blood pressure reading 03/07/2023   Preeclampsia, severe 03/07/2023   Vomiting 02/09/2023   Nausea and vomiting in pregnancy 02/09/2023   Supervision of high-risk pregnancy 02/07/2023   Obesity affecting pregnancy 02/07/2023   Abdominal cramping affecting pregnancy 02/03/2023   Nicotine dependence 10/18/2022   Marijuana use during pregnancy 10/18/2022   Anxiety 10/18/2022   PTSD (post-traumatic stress disorder) 06/19/2018   OCD (obsessive compulsive disorder) 06/19/2018   Bipolar I disorder, current or most recent episode depressed, with psychotic features (HCC) 06/18/2018   Tobacco use disorder 06/18/2018   Cannabis use disorder, moderate, dependence (HCC) 06/18/2018   Chronic hypertension affecting pregnancy 06/18/2018    Review of Systems A twelve point review of systems was negative except as stated in HPI.   HISTORY   Medications Medications Prior to Admission  Medication Sig Dispense Refill Last Dose   aspirin 81 MG chewable tablet Chew 81 mg by mouth daily.      famotidine (PEPCID) 20 MG tablet Take 1 tablet (20 mg total) by mouth daily. 30 tablet 0    magnesium oxide (MAG-OX) 400 MG tablet Take  by mouth.      metoCLOPramide (REGLAN) 10 MG tablet Take 1 tablet (10 mg total) by mouth every 8 (eight) hours as needed for nausea or vomiting. 30 tablet 1    NIFEdipine (PROCARDIA XL) 30 MG 24 hr tablet  Take 1 tablet (30 mg total) by mouth daily. Take this in the AM and your 60 in the PM 60 tablet 1    NIFEdipine (PROCARDIA XL/NIFEDICAL XL) 60 MG 24 hr tablet Take 60 mg by mouth daily.      NON FORMULARY Take 1 tablet by mouth daily. Pt taking Magnesium for headaches and leg cramps.  Unsure of dosage.      ondansetron (ZOFRAN-ODT) 4 MG disintegrating tablet Take 1 tablet (4 mg total) by mouth every 6 (six) hours as needed for nausea. 20 tablet 1    ondansetron (ZOFRAN-ODT) 4 MG disintegrating tablet Take 1-2 tablets (4-8 mg total) by mouth every 8 (eight) hours as needed for nausea or vomiting. 20 tablet 1    Prenatal Vit-Fe Fumarate-FA (PRENATAL MULTIVITAMIN) TABS tablet Take 1 tablet by mouth daily at 12 noon.      promethazine (PHENERGAN) 25 MG tablet Take 25 mg by mouth every 6 (six) hours as needed.       Allergies is allergic to amoxicillin.   OB History OB History  Gravida Para Term Preterm AB Living  3 2 2  0 0 2  SAB IAB Ectopic Multiple Live Births  0 0 0 0 2    # Outcome Date GA Lbr Len/2nd Weight Sex Type Anes PTL Lv  3 Current           2 Term 05/09/13   173 g M Vag-Spont EPI  LIV     Complications: Hypertension affecting pregnancy  1 Term 10/17/11   173 g F Vag-Spont EPI  LIV    Past Medical History Past Medical History:  Diagnosis Date   Anxiety    Depression    Hypertension     Past Surgical History Past Surgical History:  Procedure Laterality Date   TONSILLECTOMY      Social History  reports that she has been smoking cigarettes. She has never used smokeless tobacco. She reports current drug use. Drug: Marijuana. She reports that she does not drink alcohol.   Family History family history is not on file. She was adopted.   PHYSICAL EXAM   Vitals:   03/07/23 1836 03/07/23 1840 03/07/23 1845 03/07/23 1850  BP:  (!) 148/90  (!) 151/84  Pulse:  70  73  Resp:      Temp:      TempSrc:      SpO2: 95% 98% 98% 98%  Weight:      Height:         Constitutional: No acute distress, well appearing, and well nourished. Neurologic: She is alert and conversational.  Psychiatric: She has a normal mood and affect.  Musculoskeletal: Normal gait, grossly normal range of motion Cardiovascular: Normal rate.   Pulmonary/Chest: Normal work of breathing.  Gastrointestinal/Abdominal: Soft. Gravid. There is no tenderness.  Skin: Skin is warm and dry. No rash noted.  Genitourinary: deferred SVE:  deferred   SSE: deferred  NST Interpretation Indication: Elevated blood pressures Baseline: 135 bpm Variability: moderate Accelerations: present Decelerations: occasional variables, appropriate for gestational age Contractions: none Time noted:  See OBIX Impression: reactive Authenticated by: Lindalou Hose Marshay Slates   Attending Dr. Lonny Prude was immediately available for the care of the patient.   Lindalou Hose  Jeanine Caven, CNM

## 2023-03-07 NOTE — Progress Notes (Addendum)
LABOR PROGRESS NOTE   SUBJECTIVE:  Arenda Daube is a 32 y.o.  G3P2002  at [redacted]w[redacted]d with known cHTN, sent over from clinic with severe-range BPs in 170s-190s/100s-120s. CNM on call has been managing her since triage, but has now requested I take over her care.   Interval events: In triage she continued to have severe-range Bps, asymptomatic, initial PIH panel reassuring, spot UP:C= 0.28. Admitted for SIPE with severe features by Bps and has maxed out the Labetalol pathway twice.  -Received first dose of BMZ 03/07/23 at 1329 and was started on Mg gtt 4/2.   OBJECTIVE:  BP (!) 151/84   Pulse 73   Temp 98.1 F (36.7 C) (Oral)   Resp 19   Ht 5\' 4"  (1.626 m)   Wt 119.7 kg   SpO2 98%   BMI 45.32 kg/m  No intake/output data recorded.  SVE: N/A    CONTRACTIONS: none FHR: Fetal heart tracing reviewed. Baseline: 130s Variability: moderate Accelerations: present Decelerations:none Reactive and reassuring  Labs: PLT: 426 Cr: 0.66 AST: 21 ALT: 19 UP:C=0.28  ASSESSMENT/PLAN: 1)  32yo G3P2002 at [redacted]w[redacted]d with superimposed preclampsia on chronic hypertension with severe features by blood pressures, admitted for inpatient management.  -2nd dose of BMZ 03/08/23 at 1330 -Continue Mg at 2g through steroid window -Hold pt's home Nifedipine while on Mg -Daily PIH panel -Continuous EFM while on Mg -MFM consult pending, will need NICU consult at some point -Control BPs for < 160 SBP and < 110 DBP.  Has now maxed Labetalol pathway twice, have switched and ordered Hydralazine pathway. If pt reaches max dosing, will start Labetalol gtt.  -Delivery for worsening maternal or fetal status.  CNM is signing off this patient, plan discussed with RN.    Julieanne Manson, MD 03/07/2023 7:10 PM

## 2023-03-07 NOTE — OB Triage Note (Signed)
Patient sent from [facility] office for evaluation due to elevated blood pressure in office

## 2023-03-07 NOTE — Progress Notes (Signed)
Subjective/Objective - Patient continues to deny headache, vision changes, RUQ pain, contractions.  - BPP 8/8, Cat I continuous fetal heart rate monitoring. - Patient required additional IV labetalol administration of 20/40/80 mg due to ongoing severe range blood pressures.  - Patient currently on 2g/hour Magnesium sulfate infusion. - Patient has received one dose of 12 mg betamethasone.    Scheduled Meds:  betamethasone acetate-betamethasone sodium phosphate  12 mg Intramuscular Q24 Hr x 2   calcium gluconate       [START ON 03/09/2023] NIFEdipine  30 mg Oral q AM   [START ON 03/08/2023] NIFEdipine  60 mg Oral Nightly   prenatal multivitamin  1 tablet Oral Q1200   Continuous Infusions:  [START ON 03/08/2023] lactated ringers     lactated ringers 10 mL/hr at 03/07/23 1900   magnesium sulfate 2 g/hr (03/07/23 1900)   PRN Meds:acetaminophen, calcium carbonate, calcium gluconate, hydrALAZINE **AND** hydrALAZINE **AND** labetalol **AND** labetalol **AND** Measure blood pressure, ondansetron (ZOFRAN) IV, zolpidem  Vital signs in last 24 hours: Temp:  [97.9 F (36.6 C)-98.1 F (36.7 C)] 98.1 F (36.7 C) (10/22 1656) Pulse Rate:  [66-112] 77 (10/22 1923) Resp:  [18-19] 19 (10/22 1656) BP: (129-191)/(79-125) 149/98 (10/22 1923) SpO2:  [94 %-98 %] 98 % (10/22 1850) Weight:  [119.7 kg-120 kg] 119.7 kg (10/22 1130)  Intake/Output last 3 shifts: I/O last 3 completed shifts: In: 615.7 [P.O.:300; I.V.:315.7] Out: 190 [Urine:190] Intake/Output this shift: No intake/output data recorded.  Problem Assessment/Plan - Patient status reviewed with Dr. Lonny Prude, including need for additional IV labetalol administration. Dr. Lonny Prude will take over complete management of patient at this time.

## 2023-03-07 NOTE — Progress Notes (Signed)
ROB: Patient is a 32 y.o. G3P2002 at [redacted]w[redacted]d who presents for routine OB care.  Pregnancy is complicated by:   Patient Active Problem List   Diagnosis Date Noted   Vomiting 02/09/2023   Nausea and vomiting in pregnancy 02/09/2023   Supervision of high-risk pregnancy 02/07/2023   Obesity affecting pregnancy 02/07/2023   Abdominal cramping affecting pregnancy 02/03/2023   Nicotine dependence 10/18/2022   Marijuana use during pregnancy 10/18/2022   Anxiety 10/18/2022   PTSD (post-traumatic stress disorder) 06/19/2018   OCD (obsessive compulsive disorder) 06/19/2018   Bipolar I disorder, current or most recent episode depressed, with psychotic features (HCC) 06/18/2018   Tobacco use disorder 06/18/2018   Cannabis use disorder, moderate, dependence (HCC) 06/18/2018   Chronic hypertension affecting pregnancy 06/18/2018     Patient has complaints of feeling "off" this morning. Currently in office with nausea and vomiting.  BPs 191/125 with repeat 174/106, 185/116.  Has h/o cHTN, on Procardia 60 mg daily (dose recently increased last week at MFM appt). Took dose of medication around 0700 this morning. Patient with 3+ proteinuria today. Likely with superimposed pre-E. Will send to L&D for further evaluation, antenatal steroid course, and possible delivery. Discussed plans with patient and partner, note understanding.

## 2023-03-07 NOTE — Progress Notes (Signed)
ROB [redacted]w[redacted]d: She is having lots pressure and pain on and off. She is having nausea and vomiting, she is out of her medication and needs a refill. She continues to take her blood pressure medication as prescribed, her last dose was at 7 am. She has had some swelling in her feet on and off.  Her blood pressure is elevated today.  191/125 with first check and 174/106 with second check. She does not feel well at all today.

## 2023-03-07 NOTE — Progress Notes (Signed)
Notified by RN if we need to start Labetalol gtt, pt would need to be transferred to ICU, unable to run gtt on L&D. Discussed with Dr. Valentino Saxon and new plan made: -Re-start Procardia, inc dose to 60mg  BID -Will use IV Hydralazine pathway for SBP>=160 and/or DBP>=110 as previously planned -If maxes out IV pathway, pt will need to be transferred to [facility] in [place] for OB-ICU status.   Plan of care discussed with RN.    Julieanne Manson, DO [facility]

## 2023-03-08 ENCOUNTER — Encounter: Payer: Self-pay | Admitting: Obstetrics

## 2023-03-08 DIAGNOSIS — O113 Pre-existing hypertension with pre-eclampsia, third trimester: Secondary | ICD-10-CM | POA: Diagnosis not present

## 2023-03-08 DIAGNOSIS — Z3A30 30 weeks gestation of pregnancy: Secondary | ICD-10-CM | POA: Diagnosis not present

## 2023-03-08 DIAGNOSIS — O10013 Pre-existing essential hypertension complicating pregnancy, third trimester: Secondary | ICD-10-CM

## 2023-03-08 LAB — COMPREHENSIVE METABOLIC PANEL
ALT: 27 U/L (ref 0–44)
ALT: 32 U/L (ref 0–44)
AST: 27 U/L (ref 15–41)
AST: 30 U/L (ref 15–41)
Albumin: 3.1 g/dL — ABNORMAL LOW (ref 3.5–5.0)
Albumin: 3.1 g/dL — ABNORMAL LOW (ref 3.5–5.0)
Alkaline Phosphatase: 139 U/L — ABNORMAL HIGH (ref 38–126)
Alkaline Phosphatase: 147 U/L — ABNORMAL HIGH (ref 38–126)
Anion gap: 12 (ref 5–15)
Anion gap: 11 (ref 5–15)
BUN: 6 mg/dL (ref 6–20)
BUN: 6 mg/dL (ref 6–20)
CO2: 19 mmol/L — ABNORMAL LOW (ref 22–32)
CO2: 19 mmol/L — ABNORMAL LOW (ref 22–32)
Calcium: 8.4 mg/dL — ABNORMAL LOW (ref 8.9–10.3)
Calcium: 8.1 mg/dL — ABNORMAL LOW (ref 8.9–10.3)
Chloride: 101 mmol/L (ref 98–111)
Chloride: 103 mmol/L (ref 98–111)
Creatinine, Ser: 0.53 mg/dL (ref 0.44–1.00)
Creatinine, Ser: 0.54 mg/dL (ref 0.44–1.00)
GFR, Estimated: >60 mL/min (ref >60)
GFR, Estimated: >60 mL/min (ref >60)
Glucose, Bld: 125 mg/dL — ABNORMAL HIGH (ref 70–99)
Glucose, Bld: 128 mg/dL — ABNORMAL HIGH (ref 70–99)
Potassium: 4 mmol/L (ref 3.5–5.1)
Potassium: 3.9 mmol/L (ref 3.5–5.1)
Sodium: 132 mmol/L — ABNORMAL LOW (ref 135–145)
Sodium: 133 mmol/L — ABNORMAL LOW (ref 135–145)
Total Bilirubin: 0.4 mg/dL (ref 0.3–1.2)
Total Bilirubin: 0.6 mg/dL (ref 0.3–1.2)
Total Protein: 6.7 g/dL (ref 6.5–8.1)
Total Protein: 6.7 g/dL (ref 6.5–8.1)

## 2023-03-08 LAB — CBC WITH DIFFERENTIAL/PLATELET
Abs Immature Granulocytes: 0.24 10*3/uL — ABNORMAL HIGH (ref 0.00–0.07)
Basophils Absolute: 0 10*3/uL (ref 0.0–0.1)
Basophils Relative: 0 %
Eosinophils Absolute: 0.1 10*3/uL (ref 0.0–0.5)
Eosinophils Relative: 0 %
HCT: 32.9 % — ABNORMAL LOW (ref 36.0–46.0)
Hemoglobin: 10.9 g/dL — ABNORMAL LOW (ref 12.0–15.0)
Immature Granulocytes: 1 %
Lymphocytes Relative: 9 %
Lymphs Abs: 1.6 10*3/uL (ref 0.7–4.0)
MCH: 25.8 pg — ABNORMAL LOW (ref 26.0–34.0)
MCHC: 33.1 g/dL (ref 30.0–36.0)
MCV: 77.8 fL — ABNORMAL LOW (ref 80.0–100.0)
Monocytes Absolute: 0.7 10*3/uL (ref 0.1–1.0)
Monocytes Relative: 4 %
Neutro Abs: 15.2 10*3/uL — ABNORMAL HIGH (ref 1.7–7.7)
Neutrophils Relative %: 86 %
Platelets: 457 10*3/uL — ABNORMAL HIGH (ref 150–400)
RBC: 4.23 MIL/uL (ref 3.87–5.11)
RDW: 14.5 % (ref 11.5–15.5)
WBC: 17.8 10*3/uL — ABNORMAL HIGH (ref 4.0–10.5)
nRBC: 0.2 % (ref 0.0–0.2)

## 2023-03-08 LAB — CBC
HCT: 32.5 % — ABNORMAL LOW (ref 36.0–46.0)
Hemoglobin: 10.7 g/dL — ABNORMAL LOW (ref 12.0–15.0)
MCH: 25.8 pg — ABNORMAL LOW (ref 26.0–34.0)
MCHC: 32.9 g/dL (ref 30.0–36.0)
MCV: 78.3 fL — ABNORMAL LOW (ref 80.0–100.0)
Platelets: 422 10*3/uL — ABNORMAL HIGH (ref 150–400)
RBC: 4.15 MIL/uL (ref 3.87–5.11)
RDW: 14.4 % (ref 11.5–15.5)
WBC: 16.1 10*3/uL — ABNORMAL HIGH (ref 4.0–10.5)
nRBC: 0 % (ref 0.0–0.2)

## 2023-03-08 LAB — SARS CORONAVIRUS 2 BY RT PCR: SARS Coronavirus 2 by RT PCR: NEGATIVE

## 2023-03-08 LAB — PROTEIN, URINE, 24 HOUR
Collection Interval-UPROT: 24 h
Protein, 24H Urine: 460 mg/d — ABNORMAL HIGH (ref 50–100)
Protein, Urine: 23 mg/dL
Urine Total Volume-UPROT: 2000 mL

## 2023-03-08 LAB — URIC ACID: Uric Acid, Serum: 7.2 mg/dL — ABNORMAL HIGH (ref 2.5–7.1)

## 2023-03-08 LAB — LACTIC ACID, PLASMA: Lactic Acid, Venous: 1.4 mmol/L (ref 0.5–1.9)

## 2023-03-08 MED ORDER — CEFAZOLIN SODIUM-DEXTROSE 2-4 GM/100ML-% IV SOLN
2.0000 g | INTRAVENOUS | Status: AC
  Administered 2023-03-09: 2 g via INTRAVENOUS
  Filled 2023-03-08: qty 100

## 2023-03-08 MED ORDER — METOCLOPRAMIDE HCL 5 MG/ML IJ SOLN
10.0000 mg | Freq: Four times a day (QID) | INTRAMUSCULAR | Status: DC | PRN
  Administered 2023-03-08 (×2): 10 mg via INTRAVENOUS
  Filled 2023-03-08 (×3): qty 2

## 2023-03-08 MED ORDER — SODIUM CHLORIDE 0.9 % IV SOLN: INTRAVENOUS | Status: DC

## 2023-03-08 MED ORDER — SODIUM CHLORIDE 0.9 % IV SOLN
25.0000 mg | Freq: Four times a day (QID) | INTRAVENOUS | Status: DC | PRN
  Filled 2023-03-08: qty 1

## 2023-03-08 MED ORDER — SODIUM CHLORIDE 0.9% FLUSH
10.0000 mL | Freq: Two times a day (BID) | INTRAVENOUS | Status: DC
Start: 2023-03-08 — End: 2023-03-09

## 2023-03-08 MED ORDER — LACTATED RINGERS IV SOLN: INTRAVENOUS | Status: AC

## 2023-03-08 MED ORDER — LABETALOL HCL 100 MG PO TABS
200.0000 mg | ORAL_TABLET | Freq: Two times a day (BID) | ORAL | Status: DC
  Administered 2023-03-08: 200 mg via ORAL
  Filled 2023-03-08: qty 2

## 2023-03-08 MED ORDER — ONDANSETRON HCL 4 MG/2ML IJ SOLN
4.0000 mg | Freq: Four times a day (QID) | INTRAMUSCULAR | Status: DC
  Administered 2023-03-08 – 2023-03-09 (×4): 4 mg via INTRAVENOUS
  Filled 2023-03-08 (×4): qty 2

## 2023-03-08 MED ORDER — HYDROXYZINE HCL 50 MG/ML IM SOLN
50.0000 mg | Freq: Four times a day (QID) | INTRAMUSCULAR | Status: DC | PRN
  Administered 2023-03-08: 50 mg via INTRAMUSCULAR
  Filled 2023-03-08 (×2): qty 1

## 2023-03-08 MED ORDER — ONDANSETRON HCL 4 MG/2ML IJ SOLN
INTRAMUSCULAR | Status: AC
  Filled 2023-03-08: qty 2

## 2023-03-08 NOTE — H&P (Addendum)
MFM Consult Note Patient Name: Paula Keller  Patient MRN:   [id number]  Referring provider: [facility], Dr. Elvera Bicker, Dr. Valentino Saxon  Reason for Consult: early onset preeclampsia    **This was a video visit performed with the D[employer] application.  The patient's name and birthdate were verified prior to today's visit.  HPI: Paula Keller is a 32 y.o. G3P2002 at [redacted]w[redacted]d admitted for superimposed preeclampsia  Patient has a longstanding history of chronic hypertension and now was admitted for superimposed preeclampsia with severe features.  This was diagnosed by severe range blood pressures.  Upon admission she received a course of IV labetalol, was given magnesium sulfate and a betamethasone course was started.  Overnight she continued to have difficulty with her blood pressure.  Her oral medication was increased to max dose of 60 mg twice a day of Procardia.  At first her home medication was held while she was on magnesium and acute treatment of hypertension was done with 2 rounds of the labetalol protocol.  She then received hydralazine.  At this point the plan was to either deliver her or transfer her to [facility] for an MFM consult as well as NICU preparation.  However, since increasing her home labetalol her blood pressures have been better.  She continues to report a headache that resides behind her eyes as a type of pressure.  Notably she does have a history of migraines but this is a very different sensation per her report.  Her headache also improved with Tylenol but did not completely go away.  It did improve from a 5 out of 10 to a 2 out of 10 on a pain score.  So far her labs have been unremarkable except for an elevated uric acid, and elevated protein creatinine ratio.  A 24-hour urine is pending. She denies RUQ pain, LE swelling or SOB.   Review of Systems: A review of systems was performed and was negative except per HPI   Past Obstetrical History:  OB History  Gravida Para Term  Preterm AB Living  3 2 2     2   SAB IAB Ectopic Multiple Live Births          2    # Outcome Date GA Lbr Len/2nd Weight Sex Type Anes PTL Lv  3 Current           2 Term 05/09/13   3033 g M Vag-Spont EPI  LIV     Complications: Hypertension affecting pregnancy  1 Term 10/17/11   2750 g F Vag-Spont EPI  LIV   Past Medical History:  Past Medical History:  Diagnosis Date   Anxiety    Asthma    Depression    Hypertension    Past Surgical History:  Past Surgical History:  Procedure Laterality Date   TONSILLECTOMY      Family History:   family history is not on file. She was adopted.   Social History:   Social History   Socioeconomic History   Marital status: Single    Spouse name: Thayer Ohm   Number of children: Not on file   Years of education: Not on file   Highest education level: Not on file  Occupational History   Occupation: Hair Stylist  Tobacco Use   Smoking status: Every Day    Current packs/day: 0.25    Types: Cigarettes   Smokeless tobacco: Never  Vaping Use   Vaping status: Never Used  Substance and Sexual Activity   Alcohol use:  No   Drug use: Yes    Types: Marijuana   Sexual activity: Yes    Birth control/protection: Surgical  Other Topics Concern   Not on file  Social History Narrative   Not on file   Social Determinants of Health   Financial Resource Strain: Not on file  Food Insecurity: Not on file  Transportation Needs: Not on file  Physical Activity: Not on file  Stress: Not on file  Social Connections: Not on file  Intimate Partner Violence: Not on file      Home Medications:   No current facility-administered medications on file prior to encounter.   Current Outpatient Medications on File Prior to Encounter  Medication Sig Dispense Refill   aspirin 81 MG chewable tablet Chew 81 mg by mouth daily.     famotidine (PEPCID) 20 MG tablet Take 1 tablet (20 mg total) by mouth daily. 30 tablet 0   magnesium oxide (MAG-OX) 400 MG tablet Take  by mouth.     metoCLOPramide (REGLAN) 10 MG tablet Take 1 tablet (10 mg total) by mouth every 8 (eight) hours as needed for nausea or vomiting. 30 tablet 1   NIFEdipine (PROCARDIA XL) 30 MG 24 hr tablet Take 1 tablet (30 mg total) by mouth daily. Take this in the AM and your 60 in the PM 60 tablet 1   NIFEdipine (PROCARDIA XL/NIFEDICAL XL) 60 MG 24 hr tablet Take 60 mg by mouth daily.     NON FORMULARY Take 1 tablet by mouth daily. Pt taking Magnesium for headaches and leg cramps.  Unsure of dosage.     ondansetron (ZOFRAN-ODT) 4 MG disintegrating tablet Take 1 tablet (4 mg total) by mouth every 6 (six) hours as needed for nausea. 20 tablet 1   ondansetron (ZOFRAN-ODT) 4 MG disintegrating tablet Take 1-2 tablets (4-8 mg total) by mouth every 8 (eight) hours as needed for nausea or vomiting. 20 tablet 1   Prenatal Vit-Fe Fumarate-FA (PRENATAL MULTIVITAMIN) TABS tablet Take 1 tablet by mouth daily at 12 noon.     promethazine (PHENERGAN) 25 MG tablet Take 25 mg by mouth every 6 (six) hours as needed.      Allergies:   Allergies  Allergen Reactions   Amoxicillin Rash    Physical Exam:   No physical exam due to virtual visit   Assessment  Paula Keller is a 32 y.o. [id number] at [redacted]w[redacted]d admitted for superimposed preeclampsia.  Recommendations - Continue in patient care - I counseled the patient about the maternal and fetal risks of preeclampsia - BP goal is < 140/90 - Continue acute treatment of severe range BP >160/110 confirmed over 15 min - Max out two antihypertensive medications (already maxed on Procardia, consider adding PO labetalol or Coreg) - be mindful of max doses in  - CMP/CBC every 6 hours or sooner if indicated - once stable can move to q24h labs - APS labs should be drawn in the morning - Continuous fetal monitoring  - Continue magnesium sulfate gtt until 48 hours from the first dose of BZM and then reassess  - BMZ course (first dose given) - NICU consultation  -  Consider transfer to [facility] if there is a high concern for need for delivery in the next 1-4 hours.  -Delivery timing will be based on the clinical course but will try to get another few days to allow for fetal lung maturity  Delivery is indicated with any of the following (notify MFM  prior to proceeding with delivery) -Thrombocytopenia: platelet count less than 100  109/L -Renal insufficiency: serum creatinine concentrations greater than 1.1 mg/dL or a doubling of the serum creatinine concentration in the absence of other renal diseases -Impaired liver function: elevated blood concentrations of liver transaminases to twice normal concentration -Pulmonary edema -New-onset headache unresponsive to medication and not accounted for by alternative diagnoses -Visual symptoms -Non-reassuring fetal status not responsive to intrauterine resuscitation    Thank you for the opportunity to be involved with this patient's care. Please let us know if we can be of any further assistance.  Approximately 65 minutes in total were spent reviewing the patient's chart, documenting and counseling the patient of which greater than 50% was spent face-to-face in counseling.  Braxton Feathers  MFM, [facility]   03/08/2023  12:45 PM

## 2023-03-08 NOTE — Progress Notes (Addendum)
        Antenatal Progress Note  Discussion had with patient regarding plan of care.  Discussded case with OB/GYN in [place], Dr. March Rummage, who does not feel that transfer is indicated. Case also reviewed with Neonatology and MFM. Patient with significant elevations of her BP at times, currently on Procardia 60 mg BID and then initiated on Labetalol 200 mg BID earlier today.  Has required treatment at least 3 times with IV Hydralazine, and once with IV Labetalol.  Due to patient's refractory BPs, will now plan for delivery within the next 24 hours.  Discussed plan with patient and her partner regarding remaining at [facility]. Discussed NICU capabilities and comfort of being able to manage preterm infant at current hospital. Patient and partner are happy to remain at [facility] as this will be a shorter commute from home. Understand the severity of her condition and are ok with delivery plan for tomorrow. Will plan for scheduled C-section at approximately 1:30 pm, 24 hrs after final dose of antenatal steroids as patient is remote from delivery. To d/c magnesium around 12 pm in preparation for surgery. Patient has been counseled by Neonatology on newborn expectations.  Can remain on clears for now. Still also with some nausea/vomiting.  Fetal remains Category 1.

## 2023-03-08 NOTE — Consult Note (Addendum)
Asked by Dr.Cherry to provide prenatal consultation for this  32 y.o.  [id number] mother who is now [redacted]w[redacted]d with her pregnancy complicated by pre-eclampsia superimposed on chronic HTN.  She is being treated with betamethasone (2nd dose earlier today) and magnesium sulfate. Current plans are to defer delivery at least until tomorrow afternoon if her BP can be managed. Possibility of transfer to [facility] is being discussed by OB.  Discussed with patient and FOB usual expectations for their baby "Samuel Bouche" at [redacted] weeks gestation, including possible needs for DR resuscitation, respiratory support, and IV access. I explained that Samuel Bouche could probably be cared for here at [facility] but that he might require transfer to [facility] if he required extended respiratory support or otherwise needed a higher level of care. Anticipated positive long-term outlook at this EGA. Also presented usual criteria for discharge and probable length of stay in NICU until [facility]  Discussed advantages of feeding with mother's milk and possible use of donor milk as "bridge" if needed until her supply is sufficient. Parents expressed agreement with use of donor milk.  Patient and FOB were attentive, had appropriate questions, and expressed appreciation for my input.  Thank you for consulting Neonatology.  Total time 45 minutes, face-to-face time 20 minutes  JWimmer, MD

## 2023-03-08 NOTE — Progress Notes (Addendum)
Antenatal Progress Note  Subjective:     Patient ID: Paula Keller is a 32 y.o. [id number]  female [redacted]w[redacted]d, Estimated Date of Delivery: 05/16/23 dated by first trimester sono (6 weeks) who was admitted for chronic hypertension with suspected superimposed preeclampsia.  Her pregnancy is also complicated by obesity, anxiety, bipolar disorder, tobacco and marijuana use, nausea and vomiting of pregnancy, and chronic migraines.  HD# 1.   Subjective:  Patient reports that she is feeling worse than she did yesterday.  Is noting a headache that feels like it is behind her eyes.  Also has been experiencing nausea and vomiting this morning.     Review of Systems Denies contractions, leakage of fluids, vaginal bleeding, and reports good fetal movement.     Objective:   Vitals:   03/08/23 0705 03/08/23 0710 03/08/23 0715 03/08/23 0720  BP: (!) 162/99 134/75  (!) 146/89  Pulse: 95 96  97  Resp: 16     Temp: 97.9 F (36.6 C)     TempSrc: Oral     SpO2: 97% 97% 97% 98%  Weight:      Height:        General appearance: alert and no distress Lungs: clear to auscultation bilaterally Heart: regular rate and rhythm, S1, S2 normal, no murmur, click, rub or gallop Abdomen: soft, non-tender; bowel sounds normal; no masses,  no organomegaly Pelvic: deferred Extremities: extremities normal, atraumatic, no cyanosis or edema   FHT: baseline 145 bpm, accels present/absent, decels present/absent.  Variability: moderate Toco: no contractions   Fetus A Non-Stress Test Interpretation for 03/08/23  Indication: Chronic Hypertenstion and Pe-Eclampsia  Fetal Heart Rate A Mode: External Baseline Rate (A): 150 bpm Variability: Moderate Accelerations: 15 x 15 Decelerations: None  Uterine Activity Mode: None Contraction Frequency (min): none Contraction Duration (sec): 60 Contraction Quality: Mild Resting Tone Palpated: Relaxed      Labs:     Latest Ref Rng & Units 03/08/2023     8:33 AM 03/08/2023    6:10 AM 03/07/2023   11:25 AM  CBC  WBC 4.0 - 10.5 K/uL 17.8  16.1  14.2   Hemoglobin 12.0 - 15.0 g/dL 98.1  19.1  47.8   Hematocrit 36.0 - 46.0 % 32.9  32.5  34.0   Platelets 150 - 400 K/uL 457  422  426        Latest Ref Rng & Units 03/08/2023    8:19 AM 03/08/2023    6:10 AM 03/07/2023   11:25 AM  CMP  Glucose 70 - 99 mg/dL 295  621  308   BUN 6 - 20 mg/dL 6  6  6    Creatinine 0.44 - 1.00 mg/dL 6.57  8.46  9.62   Sodium 135 - 145 mmol/L 133  132  137   Potassium 3.5 - 5.1 mmol/L 3.9  4.0  3.6   Chloride 98 - 111 mmol/L 103  101  104   CO2 22 - 32 mmol/L 19  19  22    Calcium 8.9 - 10.3 mg/dL 8.1  8.4  9.2   Total Protein 6.5 - 8.1 g/dL 6.7  6.7  6.9   Total Bilirubin 0.3 - 1.2 mg/dL 0.6  0.4  0.4   Alkaline Phos 38 - 126 U/L 147  139  158   AST 15 - 41 U/L 30  27  21    ALT 0 - 44 U/L 32  27  19       Latest  Reference Range & Units 03/07/23 12:36  Total Protein, Urine mg/dL 474  Protein Creatinine Ratio 0.00 - 0.15 mg/mgCre 0.28 (H)  Creatinine, Urine mg/dL 259  (H): Data is abnormally high  Results for orders placed or performed during the hospital encounter of 03/07/23  Lactic acid, plasma  Result Value Ref Range   Lactic Acid, Venous 1.4 0.5 - 1.9 mmol/L  Type and screen [facility]  Result Value Ref Range   ABO/RH(D) A POS    Antibody Screen NEG    Sample Expiration      03/10/2023,2359 Performed at [facility], [address] [place], [place] [address]     Assessment:  32 y.o.  [id number] female [redacted]w[redacted]d, with:   Patient Active Problem List   Diagnosis Date Noted   Elevated blood pressure reading 03/07/2023   Chronic hypertension with superimposed preeclampsia 03/07/2023   Vomiting 02/09/2023   Nausea and vomiting in pregnancy 02/09/2023   Supervision of high-risk pregnancy 02/07/2023   Obesity affecting pregnancy 02/07/2023   Abdominal cramping affecting pregnancy 02/03/2023   Nicotine dependence  10/18/2022   Marijuana use during pregnancy 10/18/2022   Anxiety 10/18/2022   PTSD (post-traumatic stress disorder) 06/19/2018   OCD (obsessive compulsive disorder) 06/19/2018   Bipolar I disorder, current or most recent episode depressed, with psychotic features (HCC) 06/18/2018   Tobacco use disorder 06/18/2018   Cannabis use disorder, moderate, dependence (HCC) 06/18/2018   Chronic hypertension affecting pregnancy 06/18/2018    Plan:   Continue Magnesium Sulfate for total of 48 hours while receiving steroids. Also likely helping to manage BPs as well. Collecting 24 hr protein as initial PC ratio was normal, due to end at 1:30  pm. Will order new set of PIH labs this morning.  Continue BP management with Procardia. Initiated on 120 mg (60 BID) starting last night with better control of BPs. Required 1 dose of IV Hydralazine this morning within 1 hour before next dose of Procardia due.  If further IV treatment needed, consider adding PO Labetalol if tolerated.  Is s/p initial dose of antenatal steroids for lung maturity, second dose due at 1:30 pm today.  Will place consult for MFM and Neonatology today.  Patient with headache that was responsive to Morphine, however has returned. Patient does note h/o chronic migraines which may complicate clinical picture. Denies any other neurologic symptoms at this time. To discuss with MFM if any head imaging is indicated.  Nausea and vomiting, not well controlled on Zofran alone. Will prescribe Phenergan. Patient has had to utilize all 3 antiemetics earlier in the pregnancy (Zofran, Phenergan, and Reglan). Zofran currently prn, will change to scheduled, also will give loading dose of 8 mg with next dosing. Recent vomiting noted with 2 pills undigested (Tylenol and PNV).  Discussed with patient possibility of need for preterm delivery, also discussed possibility of transfer to tertiary facility if blood pressures are unable to be managed and symptoms  progressively worsen. Patient notes understanding.    A total of 55 minutes were spent during this encounter, including review of previous progress notes, recent imaging and labs, face-to-face with time with patient involving counseling and coordination of care, as well as documentation for current visit.   Hildred Laser, MD [facility]

## 2023-03-09 ENCOUNTER — Inpatient Hospital Stay: Payer: Medicaid Other | Admitting: Anesthesiology

## 2023-03-09 ENCOUNTER — Encounter
Admission: EM | Disposition: A | Discharge status: Home / Self Care | Payer: Self-pay | Source: Home / Self Care | Attending: Obstetrics

## 2023-03-09 ENCOUNTER — Other Ambulatory Visit: Payer: Self-pay

## 2023-03-09 DIAGNOSIS — Z3202 Encounter for pregnancy test, result negative: Secondary | ICD-10-CM

## 2023-03-09 DIAGNOSIS — F319 Bipolar disorder, unspecified: Secondary | ICD-10-CM | POA: Diagnosis not present

## 2023-03-09 DIAGNOSIS — Z302 Encounter for sterilization: Secondary | ICD-10-CM

## 2023-03-09 DIAGNOSIS — O114 Pre-existing hypertension with pre-eclampsia, complicating childbirth: Secondary | ICD-10-CM

## 2023-03-09 DIAGNOSIS — F17210 Nicotine dependence, cigarettes, uncomplicated: Secondary | ICD-10-CM

## 2023-03-09 DIAGNOSIS — O99214 Obesity complicating childbirth: Secondary | ICD-10-CM

## 2023-03-09 DIAGNOSIS — O99344 Other mental disorders complicating childbirth: Secondary | ICD-10-CM

## 2023-03-09 DIAGNOSIS — E669 Obesity, unspecified: Secondary | ICD-10-CM

## 2023-03-09 DIAGNOSIS — O99324 Drug use complicating childbirth: Secondary | ICD-10-CM

## 2023-03-09 DIAGNOSIS — F1290 Cannabis use, unspecified, uncomplicated: Secondary | ICD-10-CM

## 2023-03-09 DIAGNOSIS — O99334 Smoking (tobacco) complicating childbirth: Secondary | ICD-10-CM

## 2023-03-09 LAB — CBC WITH DIFFERENTIAL/PLATELET
Abs Immature Granulocytes: 0.25 10*3/uL — ABNORMAL HIGH (ref 0.00–0.07)
Basophils Absolute: 0 10*3/uL (ref 0.0–0.1)
Basophils Relative: 0 %
Eosinophils Absolute: 0.1 10*3/uL (ref 0.0–0.5)
Eosinophils Relative: 0 %
HCT: 25.1 % — ABNORMAL LOW (ref 36.0–46.0)
Hemoglobin: 8.2 g/dL — ABNORMAL LOW (ref 12.0–15.0)
Immature Granulocytes: 1 %
Lymphocytes Relative: 6 %
Lymphs Abs: 1.2 10*3/uL (ref 0.7–4.0)
MCH: 25.9 pg — ABNORMAL LOW (ref 26.0–34.0)
MCHC: 32.7 g/dL (ref 30.0–36.0)
MCV: 79.4 fL — ABNORMAL LOW (ref 80.0–100.0)
Monocytes Absolute: 0.8 10*3/uL (ref 0.1–1.0)
Monocytes Relative: 4 %
Neutro Abs: 17 10*3/uL — ABNORMAL HIGH (ref 1.7–7.7)
Neutrophils Relative %: 89 %
Platelets: 399 10*3/uL (ref 150–400)
RBC: 3.16 MIL/uL — ABNORMAL LOW (ref 3.87–5.11)
RDW: 15.1 % (ref 11.5–15.5)
WBC: 19.4 10*3/uL — ABNORMAL HIGH (ref 4.0–10.5)
nRBC: 0.1 % (ref 0.0–0.2)

## 2023-03-09 LAB — COMPREHENSIVE METABOLIC PANEL
ALT: 62 U/L — ABNORMAL HIGH (ref 0–44)
AST: 48 U/L — ABNORMAL HIGH (ref 15–41)
Albumin: 3.1 g/dL — ABNORMAL LOW (ref 3.5–5.0)
Alkaline Phosphatase: 144 U/L — ABNORMAL HIGH (ref 38–126)
Anion gap: 11 (ref 5–15)
BUN: 9 mg/dL (ref 6–20)
CO2: 19 mmol/L — ABNORMAL LOW (ref 22–32)
Calcium: 8 mg/dL — ABNORMAL LOW (ref 8.9–10.3)
Chloride: 106 mmol/L (ref 98–111)
Creatinine, Ser: 0.51 mg/dL (ref 0.44–1.00)
GFR, Estimated: >60 mL/min (ref >60)
Glucose, Bld: 101 mg/dL — ABNORMAL HIGH (ref 70–99)
Potassium: 3.8 mmol/L (ref 3.5–5.1)
Sodium: 136 mmol/L (ref 135–145)
Total Bilirubin: 0.9 mg/dL (ref 0.3–1.2)
Total Protein: 6.5 g/dL (ref 6.5–8.1)

## 2023-03-09 LAB — RAPID HIV SCREEN (HIV 1/2 AB+AG)
HIV 1/2 Antibodies: NONREACTIVE
HIV-1 P24 Antigen - HIV24: NONREACTIVE

## 2023-03-09 LAB — CBC
HCT: 33.2 % — ABNORMAL LOW (ref 36.0–46.0)
Hemoglobin: 10.6 g/dL — ABNORMAL LOW (ref 12.0–15.0)
MCH: 25.5 pg — ABNORMAL LOW (ref 26.0–34.0)
MCHC: 31.9 g/dL (ref 30.0–36.0)
MCV: 80 fL (ref 80.0–100.0)
Platelets: 491 10*3/uL — ABNORMAL HIGH (ref 150–400)
RBC: 4.15 MIL/uL (ref 3.87–5.11)
RDW: 15 % (ref 11.5–15.5)
WBC: 17.1 10*3/uL — ABNORMAL HIGH (ref 4.0–10.5)
nRBC: 0.2 % (ref 0.0–0.2)

## 2023-03-09 LAB — MAGNESIUM: Magnesium: 5 mg/dL — ABNORMAL HIGH (ref 1.7–2.4)

## 2023-03-09 SURGERY — Surgical Case
Anesthesia: Spinal

## 2023-03-09 MED ORDER — MAGNESIUM SULFATE 40 GM/1000ML IV SOLN
2.0000 g/h | INTRAVENOUS | Status: AC
  Administered 2023-03-10: 2 g/h via INTRAVENOUS
  Filled 2023-03-09: qty 1000

## 2023-03-09 MED ORDER — SIMETHICONE 80 MG PO CHEW
80.0000 mg | CHEWABLE_TABLET | ORAL | Status: DC | PRN
  Administered 2023-03-10 – 2023-03-11 (×2): 80 mg via ORAL

## 2023-03-09 MED ORDER — LIDOCAINE 5 % EX PTCH
MEDICATED_PATCH | CUTANEOUS | Status: AC
  Filled 2023-03-09: qty 1

## 2023-03-09 MED ORDER — MEPERIDINE HCL 25 MG/ML IJ SOLN: 6.2500 mg | INTRAMUSCULAR | Status: DC | PRN

## 2023-03-09 MED ORDER — CARBOPROST TROMETHAMINE 250 MCG/ML IM SOLN
250.0000 ug | Freq: Once | INTRAMUSCULAR | Status: AC
  Administered 2023-03-09: 250 ug via INTRAMUSCULAR

## 2023-03-09 MED ORDER — MORPHINE SULFATE (PF) 0.5 MG/ML IJ SOLN
INTRAMUSCULAR | Status: AC
  Filled 2023-03-09: qty 10

## 2023-03-09 MED ORDER — PHENYLEPHRINE 80 MCG/ML (10ML) SYRINGE FOR IV PUSH (FOR BLOOD PRESSURE SUPPORT)
PREFILLED_SYRINGE | INTRAVENOUS | Status: DC | PRN
  Administered 2023-03-09 (×2): 160 ug via INTRAVENOUS

## 2023-03-09 MED ORDER — KETOROLAC TROMETHAMINE 30 MG/ML IJ SOLN
INTRAMUSCULAR | Status: DC | PRN
  Administered 2023-03-09: 30 mg via INTRAVENOUS

## 2023-03-09 MED ORDER — NIFEDIPINE ER OSMOTIC RELEASE 30 MG PO TB24
60.0000 mg | ORAL_TABLET | Freq: Every evening | ORAL | Status: DC
  Administered 2023-03-10 – 2023-03-12 (×3): 60 mg via ORAL
  Filled 2023-03-09 (×4): qty 2

## 2023-03-09 MED ORDER — IBUPROFEN 600 MG PO TABS
600.0000 mg | ORAL_TABLET | Freq: Four times a day (QID) | ORAL | Status: DC
  Administered 2023-03-11 – 2023-03-12 (×7): 600 mg via ORAL
  Filled 2023-03-09 (×8): qty 1

## 2023-03-09 MED ORDER — KETOROLAC TROMETHAMINE 30 MG/ML IJ SOLN
INTRAMUSCULAR | Status: AC
  Filled 2023-03-09: qty 1

## 2023-03-09 MED ORDER — SIMETHICONE 80 MG PO CHEW
80.0000 mg | CHEWABLE_TABLET | Freq: Three times a day (TID) | ORAL | Status: DC
  Administered 2023-03-10 – 2023-03-12 (×6): 80 mg via ORAL
  Filled 2023-03-09 (×8): qty 1

## 2023-03-09 MED ORDER — CALCIUM CARBONATE ANTACID 500 MG PO CHEW: 2.0000 | CHEWABLE_TABLET | Freq: Three times a day (TID) | ORAL | Status: DC | PRN

## 2023-03-09 MED ORDER — ALBUTEROL SULFATE (2.5 MG/3ML) 0.083% IN NEBU: 3.0000 mL | INHALATION_SOLUTION | RESPIRATORY_TRACT | Status: DC | PRN

## 2023-03-09 MED ORDER — MAGNESIUM SULFATE 40 GM/1000ML IV SOLN
INTRAVENOUS | Status: DC | PRN
  Administered 2023-03-09: 2 g/h via INTRAVENOUS

## 2023-03-09 MED ORDER — DIPHENHYDRAMINE HCL 25 MG PO CAPS: 25.0000 mg | ORAL_CAPSULE | ORAL | Status: DC | PRN

## 2023-03-09 MED ORDER — FENTANYL CITRATE (PF) 100 MCG/2ML IJ SOLN
INTRAMUSCULAR | Status: AC
  Filled 2023-03-09: qty 2

## 2023-03-09 MED ORDER — PROPOFOL 10 MG/ML IV BOLUS
INTRAVENOUS | Status: AC
  Filled 2023-03-09: qty 20

## 2023-03-09 MED ORDER — FENTANYL CITRATE (PF) 100 MCG/2ML IJ SOLN
INTRAMUSCULAR | Status: DC | PRN
  Administered 2023-03-09: 15 ug via INTRATHECAL

## 2023-03-09 MED ORDER — KETOROLAC TROMETHAMINE 30 MG/ML IJ SOLN
30.0000 mg | Freq: Four times a day (QID) | INTRAMUSCULAR | Status: AC | PRN
Start: 2023-03-09 — End: 2023-03-10
  Administered 2023-03-09: 30 mg via INTRAVENOUS
  Filled 2023-03-09 (×2): qty 1

## 2023-03-09 MED ORDER — OXYTOCIN-SODIUM CHLORIDE 30-0.9 UT/500ML-% IV SOLN
2.5000 [IU]/h | INTRAVENOUS | Status: AC
  Administered 2023-03-09: 2.5 [IU]/h via INTRAVENOUS
  Filled 2023-03-09: qty 500

## 2023-03-09 MED ORDER — ONDANSETRON HCL 4 MG/2ML IJ SOLN: 4.0000 mg | Freq: Three times a day (TID) | INTRAMUSCULAR | Status: DC | PRN

## 2023-03-09 MED ORDER — ONDANSETRON HCL 4 MG/2ML IJ SOLN
INTRAMUSCULAR | Status: DC | PRN
  Administered 2023-03-09: 4 mg via INTRAVENOUS

## 2023-03-09 MED ORDER — MISOPROSTOL 200 MCG PO TABS
200.0000 ug | ORAL_TABLET | Freq: Once | ORAL | Status: AC
  Administered 2023-03-09: 200 ug via RECTAL

## 2023-03-09 MED ORDER — TRANEXAMIC ACID-NACL 1000-0.7 MG/100ML-% IV SOLN
1000.0000 mg | INTRAVENOUS | Status: AC
  Administered 2023-03-09: 1000 mg via INTRAVENOUS

## 2023-03-09 MED ORDER — KETOROLAC TROMETHAMINE 30 MG/ML IJ SOLN
30.0000 mg | Freq: Four times a day (QID) | INTRAMUSCULAR | Status: AC
  Administered 2023-03-10 (×4): 30 mg via INTRAVENOUS
  Filled 2023-03-09 (×3): qty 1

## 2023-03-09 MED ORDER — OXYTOCIN-SODIUM CHLORIDE 30-0.9 UT/500ML-% IV SOLN
INTRAVENOUS | Status: DC | PRN
  Administered 2023-03-09: 250 mL/h via INTRAVENOUS

## 2023-03-09 MED ORDER — DEXAMETHASONE SODIUM PHOSPHATE 10 MG/ML IJ SOLN
INTRAMUSCULAR | Status: DC | PRN
  Administered 2023-03-09: 10 mg via INTRAVENOUS

## 2023-03-09 MED ORDER — SOD CITRATE-CITRIC ACID 500-334 MG/5ML PO SOLN
ORAL | Status: AC
  Filled 2023-03-09: qty 15

## 2023-03-09 MED ORDER — DIPHENHYDRAMINE HCL 50 MG/ML IJ SOLN: 12.5000 mg | INTRAMUSCULAR | Status: DC | PRN

## 2023-03-09 MED ORDER — PHENYLEPHRINE HCL-NACL 20-0.9 MG/250ML-% IV SOLN
INTRAVENOUS | Status: DC | PRN
  Administered 2023-03-09: 40 ug/min via INTRAVENOUS

## 2023-03-09 MED ORDER — ALBUTEROL SULFATE HFA 108 (90 BASE) MCG/ACT IN AERS: 2.0000 | INHALATION_SPRAY | RESPIRATORY_TRACT | Status: DC | PRN

## 2023-03-09 MED ORDER — CALCIUM GLUCONATE 10 % IV SOLN
INTRAVENOUS | Status: AC
  Filled 2023-03-09: qty 10

## 2023-03-09 MED ORDER — OXYTOCIN-SODIUM CHLORIDE 30-0.9 UT/500ML-% IV SOLN
INTRAVENOUS | Status: AC
  Filled 2023-03-09: qty 500

## 2023-03-09 MED ORDER — ACETAMINOPHEN 500 MG PO TABS
1000.0000 mg | ORAL_TABLET | Freq: Four times a day (QID) | ORAL | Status: DC
  Administered 2023-03-09: 1000 mg via ORAL
  Filled 2023-03-09: qty 2

## 2023-03-09 MED ORDER — BUPIVACAINE HCL (PF) 0.75 % IJ SOLN
INTRAMUSCULAR | Status: DC | PRN
  Administered 2023-03-09: 1.6 mL via INTRATHECAL

## 2023-03-09 MED ORDER — MORPHINE SULFATE (PF) 0.5 MG/ML IJ SOLN
INTRAMUSCULAR | Status: DC | PRN
  Administered 2023-03-09: .1 mg via INTRATHECAL

## 2023-03-09 MED ORDER — LACTATED RINGERS IV SOLN: INTRAVENOUS | Status: AC

## 2023-03-09 MED ORDER — NALOXONE HCL 4 MG/10ML IJ SOLN: 1.0000 ug/kg/h | INTRAVENOUS | Status: DC | PRN

## 2023-03-09 MED ORDER — NALOXONE HCL 0.4 MG/ML IJ SOLN: 0.4000 mg | INTRAMUSCULAR | Status: DC | PRN

## 2023-03-09 MED ORDER — NIFEDIPINE ER OSMOTIC RELEASE 30 MG PO TB24
60.0000 mg | ORAL_TABLET | Freq: Every morning | ORAL | Status: DC
  Administered 2023-03-10 – 2023-03-12 (×3): 60 mg via ORAL
  Filled 2023-03-09 (×3): qty 2

## 2023-03-09 MED ORDER — ALBUTEROL SULFATE (2.5 MG/3ML) 0.083% IN NEBU
3.0000 mL | INHALATION_SOLUTION | RESPIRATORY_TRACT | Status: DC | PRN
  Filled 2023-03-09: qty 3

## 2023-03-09 MED ORDER — SCOPOLAMINE 1 MG/3DAYS TD PT72: 1.0000 | MEDICATED_PATCH | Freq: Once | TRANSDERMAL | Status: DC

## 2023-03-09 MED ORDER — ACETAMINOPHEN 500 MG PO TABS
1000.0000 mg | ORAL_TABLET | Freq: Four times a day (QID) | ORAL | Status: DC
  Administered 2023-03-10 – 2023-03-12 (×9): 1000 mg via ORAL
  Filled 2023-03-09 (×9): qty 2

## 2023-03-09 MED ORDER — CARBOPROST TROMETHAMINE 250 MCG/ML IM SOLN
INTRAMUSCULAR | Status: AC
  Filled 2023-03-09: qty 1

## 2023-03-09 MED ORDER — OXYCODONE HCL 5 MG PO TABS: 5.0000 mg | ORAL_TABLET | ORAL | Status: DC | PRN

## 2023-03-09 MED ORDER — ALBUTEROL SULFATE HFA 108 (90 BASE) MCG/ACT IN AERS
INHALATION_SPRAY | RESPIRATORY_TRACT | Status: AC
  Filled 2023-03-09: qty 6.7

## 2023-03-09 MED ORDER — KETOROLAC TROMETHAMINE 30 MG/ML IJ SOLN
30.0000 mg | Freq: Four times a day (QID) | INTRAMUSCULAR | Status: AC | PRN
Start: 2023-03-09 — End: 2023-03-10

## 2023-03-09 MED ORDER — TRANEXAMIC ACID-NACL 1000-0.7 MG/100ML-% IV SOLN
INTRAVENOUS | Status: AC
  Filled 2023-03-09: qty 100

## 2023-03-09 MED ORDER — LABETALOL HCL 200 MG PO TABS
200.0000 mg | ORAL_TABLET | Freq: Two times a day (BID) | ORAL | Status: DC
  Administered 2023-03-10 – 2023-03-12 (×5): 200 mg via ORAL
  Filled 2023-03-09 (×5): qty 1

## 2023-03-09 MED ORDER — MAGNESIUM SULFATE BOLUS VIA INFUSION
2.0000 g | Freq: Once | INTRAVENOUS | Status: DC
  Filled 2023-03-09: qty 1000

## 2023-03-09 MED ORDER — SODIUM CHLORIDE 0.9% FLUSH
3.0000 mL | INTRAVENOUS | Status: DC | PRN
Start: 2023-03-09 — End: 2023-03-13

## 2023-03-09 MED ORDER — BUPIVACAINE HCL (PF) 0.75 % IJ SOLN: INTRAMUSCULAR | Status: DC | PRN

## 2023-03-09 MED ORDER — AMMONIA AROMATIC IN INHA
RESPIRATORY_TRACT | Status: AC
  Filled 2023-03-09: qty 10

## 2023-03-09 MED ORDER — LOPERAMIDE HCL 2 MG PO CAPS: 2.0000 mg | ORAL_CAPSULE | ORAL | Status: DC | PRN

## 2023-03-09 SURGICAL SUPPLY — 6 items
APL SKNCLS STERI-STRIP NONHPOA (GAUZE/BANDAGES/DRESSINGS) ×2
BENZOIN TINCTURE PRP APPL 2/3 (GAUZE/BANDAGES/DRESSINGS) ×1 IMPLANT
DRESSING PEEL AND PLC PRVNA 13 (GAUZE/BANDAGES/DRESSINGS) ×1 IMPLANT
DRSG PEEL AND PLACE PREVENA 13 (GAUZE/BANDAGES/DRESSINGS) ×2
PREVENA INCISION MGT 90 150 (MISCELLANEOUS) ×1 IMPLANT
RETRACTOR TRAXI PANNICULUS (MISCELLANEOUS) ×1 IMPLANT

## 2023-03-09 NOTE — Significant Event (Signed)
Called to the SCN to assess the patient after she had a syncopal episode. On my arrival, Paula Keller had regained consciousness and was lying on a hospital bed in the [facility]. There was a moderate amount of BRB on her pad. Her BP was 130s/80s. Her fundus was firm and she was not actively bleeding. She was brought back to the room for further assessment.  She was given 800 mcg of cytotec PR. Her fundus remained firm. A sweep of the cervix was performed with bimanual uterine massage. No further clots were expressed and bleeding was light. Paula Keller was alert and responsive, although still feeling unwell. CBC and mag level were ordered. Dr. Lonny Prude was notified and requested TXA and hemabate be administered. She is on her way to the hospital to assess.  Glenetta Borg, CNM

## 2023-03-09 NOTE — Anesthesia Preprocedure Evaluation (Signed)
Anesthesia Evaluation  Patient identified by MRN, date of birth, ID band Patient awake    Reviewed: Allergy & Precautions, NPO status , Patient's Chart, lab work & pertinent test results  Airway Mallampati: III  TM Distance: >3 FB Neck ROM: full    Dental  (+) Chipped, Dental Advidsory Given   Pulmonary asthma , Current Smoker   Pulmonary exam normal        Cardiovascular Exercise Tolerance: Good hypertension, negative cardio ROS Normal cardiovascular exam     Neuro/Psych  PSYCHIATRIC DISORDERS         GI/Hepatic negative GI ROS,,,  Endo/Other    Morbid obesity  Renal/GU   negative genitourinary   Musculoskeletal   Abdominal   Peds  Hematology negative hematology ROS (+)   Anesthesia Other Findings Past Medical History: No date: Anxiety No date: Asthma No date: Depression No date: Hypertension  Past Surgical History: No date: TONSILLECTOMY  BMI    Body Mass Index: 45.32 kg/m      Reproductive/Obstetrics (+) Pregnancy                             Anesthesia Physical Anesthesia Plan  ASA: 3  Anesthesia Plan: Spinal   Post-op Pain Management:    Induction:   PONV Risk Score and Plan: 2 and Ondansetron and TIVA  Airway Management Planned: Natural Airway and Nasal Cannula  Additional Equipment:   Intra-op Plan:   Post-operative Plan:   Informed Consent: I have reviewed the patients History and Physical, chart, labs and discussed the procedure including the risks, benefits and alternatives for the proposed anesthesia with the patient or authorized representative who has indicated his/her understanding and acceptance.     Dental Advisory Given  Plan Discussed with: Anesthesiologist, CRNA and Surgeon  Anesthesia Plan Comments: (Patient reports no bleeding problems and no anticoagulant use.  Plan for spinal with backup GA  Patient consented for risks of anesthesia  including but not limited to:  - adverse reactions to medications - damage to eyes, teeth, lips or other oral mucosa - nerve damage due to positioning  - risk of bleeding, infection and or nerve damage from spinal that could lead to paralysis - risk of headache or failed spinal - damage to teeth, lips or other oral mucosa - sore throat or hoarseness - damage to heart, brain, nerves, lungs, other parts of body or loss of life  Patient voiced understanding and assent.)       Anesthesia Quick Evaluation

## 2023-03-09 NOTE — Anesthesia Procedure Notes (Signed)
Spinal  Patient location during procedure: OR Start time: 03/09/2023 1:55 PM End time: 03/09/2023 1:57 PM Reason for block: surgical anesthesia Staffing Performed: anesthesiologist  Anesthesiologist: Stephanie Coup, MD Performed by: Stephanie Coup, MD Authorized by: Stephanie Coup, MD   Preanesthetic Checklist Completed: patient identified, IV checked, site marked, risks and benefits discussed, surgical consent, monitors and equipment checked, pre-op evaluation and timeout performed Spinal Block Patient position: sitting Prep: Betadine Patient monitoring: heart rate, continuous pulse ox, blood pressure and cardiac monitor Approach: midline Location: L3-4 Injection technique: single-shot Needle Needle type: Whitacre and Introducer  Needle gauge: 24 G Needle length: 9 cm Assessment Sensory level: T4 Events: CSF return

## 2023-03-09 NOTE — Progress Notes (Signed)
S:  Pt is POD#0 s/p pLTCS at [redacted]w[redacted]d for SIPE on cHTN w/SFs Notified by L&D that pt had gone to SCN to see baby, stood up, had "large clots" out, and fainted. They were concerned that she could have possibly seized. En route to hospital, notified by Acquanetta Sit, CNM, that pt was A&O, BPs were normotensive to mild-range and she had placed Cytotec PR. Ordered Hemabate, TXA, and more Pitocin.  On arrival, pt in bed laughing, talking clearly, and reports that when she stood up to look at baby, she felt a warm, fuzzy feeling come over her, heard ringing in her ears, and could tell she was going to faint. She reports this happened to her this past July '24 while grocery shopping and standing in line, she felt the same prodromal symptoms and then fainted. Pt reports she immediately regained consciousness, denies any confusion, knew what had happened. Husband was concerned with rigidity he saw and felt it may be seizure. He denies seeing any jerking or tonic/clonic motions.   O: Vitals:   03/09/23 1730 03/09/23 1834  BP: (!) 148/90 (!) 149/82  Pulse: 90 90  Resp: 16   Temp: 97.9 F (36.6 C)   SpO2:     BP while in room: 124/78, asked RN to hold Procardia 60mg   PE:  GEN: A&O x 3, NAD CV: RRR RESP: CTAB ABD: Fundus firm at U, incision dressing intact EXT: Trace BLE edema, +2/2 DTRs, no clonus NEURO: No gross or focal deficit  EBL since surgery: -420cc out in NICU -350cc out on L&D -200cc on pad since  19:53  H/H: 8.2/25.1 (post- bleeding) Magnesium: 5.0  A/P: Pt is POD#0 s/p pLTCS at [redacted]w[redacted]d for SIPE on cHTN w/Sfs, now with 1620cc including surgery, and with what is most likely a vasovagal event due in part to her acute blood loss while in special care nursery, not an eclamptic seizure. Pt hemodynamically and neurologically stable; we discussed while she remains on Mag, should not stand unassisted.  -Has now received Cytotec , Hemabate x 1, and TXA 1g is hanging, with an  additional bag of Pitocin over the next 8hrs.  -Continue to monitor blood loss closely, will transfuse for Hgb < 7 with symptoms. Next CBC currently ordered for am, but draw sooner if further loss or concerns.  -Continue daily PIH labs, ordered for am -Continue Magnesium for 24hrs post-op, if stable, may come off at 1430 tomorrow -Keep BPs < 160/110; hold PO anti-HTN meds for BPs < 140/90   Nailani Full [facility]

## 2023-03-09 NOTE — Anesthesia Procedure Notes (Signed)
Spinal  Staffing Performed by: Stephanie Coup, MD Authorized by: Stephanie Coup, MD

## 2023-03-09 NOTE — Op Note (Signed)
CESAREAN SECTION OPERATIVE REPORT   DATE OF SURGERY: 03/09/2023  SURGEON: Dr. Julieanne Manson ASSISTANT:  Dr. Brennan Bailey ANESTHESIA: Spinal by CRNA  PROCEDURE: Primary low transverse cesarean section Bilateral tubal ligation, Pomeroy method  PREOPERATIVE DIAGNOSES: 1. Intrauterine pregnancy at [redacted]w[redacted]d 2. Superimposed preeclampsia on chronic HTN with severe features 3. Remote from delivery  POSTOPERATIVE DIAGNOSES: 1. Same, s/p pLTCS and BTL  QBL:   650cc DRAINS: foley catheter to gravity drainage, 10ml of clear urine at end of the procedure TOTAL IV FLUIDS: Total I/O In: 600 [I.V.:600] Out: 1600 [Urine:950; Blood:650] ml SPECIMENS: Bilateral fallopian tubes COMPLICATIONS: Prematurity of infant  FINDINGS:  Viable female infant in cephalic presentation; APGARs 0/8/6; weight 1500 grams (3lbs, 5oz) Clear fluid at amniotomy Intact placenta with 3 vessel cord Uterus, tubes, and ovaries appeared normal  INDICATION and CONSENT: Paula Keller is a 32 y.o. [phone] with IUP at [redacted]w[redacted]d admitted for superimposed preeclampsia on chronic hypertension with severe features by BPs and remote from delivery with continued blood pressures in the severe range. MFM recommended we deliver once steroid complete. The patient understood that the risks of cesarean section include, but are not limited to, visceral or vascular injury, infection, blood loss and need for transfusion, prolonged hospitalization, and reoperation.  The patient stated understanding and desired to proceed.  All questions were answered.  PROCEDURE:  After verbal and written informed consent was obtained, the patient was taken to the operating room where spinal anesthesia was found to be adequate.  SCDs were applied to the lower extremities and a Foley catheter was placed in the bladder under sterile technique.  The patient was placed in dorsal supine position with a leftward tilt, prepped and draped in a sterile fashion.  Two grams  of Cefazolin were given for infection prophylaxis.  Level of anesthesia was confirmed to be adequate with Allis clamps.  A Pfannenstiel skin incision was made with the scalpel and carried down to the underlying layer of rectus fascia.  The fascia was nicked bilaterally in the midline with the scalpel and the fascial incision was extended laterally using Mayo scissors.  The superior aspect of the fascia was grasped with Kocher clamps and the underlying rectus muscles were dissected off sharply with Mayo scissors and bluntly.  In a similar fashion, the inferior aspect of fascia was grasped with Kocher clamps and the underlying rectus and pyramidalis were dissected off sharply and bluntly.  The rectus muscles were separated in the midline bluntly and with the Bovie. sharply.  The peritoneum was found to be free of adherent bowel and entered bluntly.  The peritoneal incision was extended bluntly to the bladder reflection with good visualization of the bladder.  The Alexis retractor was inserted and vesicouterine peritoneum identified.  Intraabdomnial survey revealed scant, clear peritoneal fluid and a thinned-out lower uterine segment.  The lower uterine segment was incised transversely with the scalpel.  The amniotic sac was ruptured with an Allis clamp and clear fluid noted.  The uterine incision was extended bluntly in a cranial-caudal fashion.  The fetus was in cephalic presentation.  The head was flexed and elevated to the level of the uterine incision.  Gentle fundal pressure was applied by the assistant and the infant was delivered without difficulty.  There was a loose nuchal cord x 2, easily reduced. The nose and mouth were suctioned with a bulb. Infant had good tone and delayed cord clamping was performed for 30 seconds. The cord was doubly clamped and cut.  The infant was handed to the awaiting NICU team.  The placenta delivered intact & spontaneously with manual massage of the uterine fundus. The uterus  was exteriorized.  The inside of the uterus was gently wiped with lap sponges x 2 ensuring complete removal of placental membranes.  The uterine incision was repaired with a double layer closure of 0-Vicryl first in a locking fashion, followed by 0-Vicryl in an imbricating stitch, with excellent hemostasis achieved.  The ovaries and tubes were found to be grossly normal.    Attention was then turned to the fallopian tubes. The left fallopian tube was identified and grasped with a Babcock clamp.  The tube was followed out to the fimbriae. The distal and proximal ends of the tube were ligated together with 3-0 Plain suture. The tube was excised and sent to pathology for confirmation. Good hemostasis on both ends of the tubal stumps were noted. The peritoneal edge of the broad ligament was noted to be hemostatic. The tube was returned to the abdominal cavity.  The right fallopian tube was ligated in a similar fashion.  Good hemostasis was again noted.   The uterus, tubes, and ovaries were then returned to the abdominal cavity in the anatomical position.  The posterior cul-de-sac was cleared of clots and debris. Blood clots, debris and fluid were cleaned from the abdomen, gutters, and pelvis with moist laparotomy sponges.  The uterine incision was reinspected and was hemostatic, as were the tubal sites.  The superior and inferior fascia were grasped with Kocher clamps and the rectus muscles were examined and found to be hemostatic, ensured with Bovie electrocautery.  The peritoneum was not closed and the rectus muscles were not reapproximated. The fascial layer was closed with 0-Vicryl in a running fashion.  The subcutaneous tissue was irrigated, made hemostatic with Bovie electrocautery, then reapproximated with a running layer of 3-0 Plain. The skin was closed subcuticularly with 4-0 Vicryl on a keith and steristrips and a sterile wound vac was placed.  All sponge, lap and instrument counts were correct x 2. The  patient tolerated the procedure well and was taken to the recovery room in stable condition.  An experienced assistant was required given the standard of surgical care given the complexity of the case.  This assistant was needed for exposure, dissection, suctioning, retraction, instrument exchange, and for overall help during the procedure.   Julieanne Manson, DO  [facility]

## 2023-03-09 NOTE — Transfer of Care (Signed)
Immediate Anesthesia Transfer of Care Note  Patient: Paula Keller  Procedure(s) Performed: CESAREAN SECTION  Patient Location: PACU and Mother/Baby  Anesthesia Type:Spinal  Level of Consciousness: awake, alert , and oriented  Airway & Oxygen Therapy: Patient Spontanous Breathing  Post-op Assessment: Report given to RN and Post -op Vital signs reviewed and stable  Post vital signs: Reviewed and stable  Last Vitals:  Vitals Value Taken Time  BP 123/63 03/09/23 1523  Temp    Pulse    Resp 16 03/09/23 1523  SpO2 97 % 03/09/23 1523    Last Pain:  Vitals:   03/09/23 0858  TempSrc: Axillary  PainSc:       Patients Stated Pain Goal: 0 (03/09/23 0830)  Complications: No notable events documented.

## 2023-03-09 NOTE — Progress Notes (Signed)
[facility] ANTEPARTUM PROGRESS NOTE  Paula Keller is a 32 y.o. G3P2002 at [redacted]w[redacted]d by who is admitted for superimposed preeclampsia on chronic HTN with severe features by BPs .  Estimated Date of Delivery: 05/16/23 BY 6wk [place].  Fetal presentation is cephalic.  Length of Stay:  2 Days. Admitted 03/07/2023  Subjective: Reporting HA this am. Does have hx of chronic migraines and has been on Mg since admission. This HA will improve with Tylenol, but return. Denies vision changes, RUQ/epigastric pain, SOB. Patient reports good fetal movement.  She reports no uterine contractions, no bleeding and no loss of fluid per vagina. Pt also expresses her desire for a tubal ligation.   Vitals:  Blood pressure (!) 156/93, pulse (!) 108, temperature 98.3 F (36.8 C), temperature source Oral, resp. rate 20, height 5\' 4"  (1.626 m), weight 119.7 kg, SpO2 96%. Physical Examination: CONSTITUTIONAL: Well-developed, well-nourished female in no acute distress.  HENT:  Normocephalic, atraumatic EYES: Conjunctivae and EOM are normal. No scleral icterus.  NECK: Normal range of motion, supple, no masses SKIN: Skin is warm and dry. No rash noted. Not diaphoretic. No erythema. No pallor. NEUROLGIC: Alert and oriented to person, place, and time. Normal reflexes, muscle tone coordination. No cranial nerve deficit noted. +2/2 DTRs, no clonus. PSYCHIATRIC: Normal mood and affect. Normal behavior. Normal judgment and thought content. CARDIOVASCULAR: Normal heart rate noted, regular rhythm RESPIRATORY: Effort and breath sounds normal, no problems with respiration noted MUSCULOSKELETAL: Normal range of motion. Trace BLE edema and no tenderness. 2+ distal pulses. ABDOMEN: Soft, nontender, nondistended, gravid. CERVIX:  deferred  Fetal monitoring: FHR: 130s bpm, Variability: moderate, Accelerations: Present, Decelerations: Absent  Uterine activity: 0 contractions per hour  Results for orders placed or performed  during the hospital encounter of 03/07/23 (from the past 48 hour(s))  CBC     Status: Abnormal   Collection Time: 03/07/23 11:25 AM  Result Value Ref Range   WBC 14.2 (H) 4.0 - 10.5 K/uL   RBC 4.37 3.87 - 5.11 MIL/uL   Hemoglobin 11.3 (L) 12.0 - 15.0 g/dL   HCT 23.5 (L) 57.3 - 22.0 %   MCV 77.8 (L) 80.0 - 100.0 fL   MCH 25.9 (L) 26.0 - 34.0 pg   MCHC 33.2 30.0 - 36.0 g/dL   RDW 25.4 27.0 - 62.3 %   Platelets 426 (H) 150 - 400 K/uL   nRBC 0.0 0.0 - 0.2 %    Comment: Performed at [facility], [address] [place], [place] [address]  Comprehensive metabolic panel     Status: Abnormal   Collection Time: 03/07/23 11:25 AM  Result Value Ref Range   Sodium 137 135 - 145 mmol/L   Potassium 3.6 3.5 - 5.1 mmol/L   Chloride 104 98 - 111 mmol/L   CO2 22 22 - 32 mmol/L   Glucose, Bld 106 (H) 70 - 99 mg/dL    Comment: Glucose reference range applies only to samples taken after fasting for at least 8 hours.   BUN 6 6 - 20 mg/dL   Creatinine, Ser 1.51 0.44 - 1.00 mg/dL   Calcium 9.2 8.9 - 76.1 mg/dL   Total Protein 6.9 6.5 - 8.1 g/dL   Albumin 3.2 (L) 3.5 - 5.0 g/dL   AST 21 15 - 41 U/L   ALT 19 0 - 44 U/L   Alkaline Phosphatase 158 (H) 38 - 126 U/L   Total Bilirubin 0.4 0.3 - 1.2 mg/dL   GFR, Estimated >60 >73  mL/min    Comment: (NOTE) Calculated using the CKD-EPI Creatinine Equation (2021)    Anion gap 11 5 - 15    Comment: Performed at [facility], [address] [place], [place] [address]  Protein / creatinine ratio, urine     Status: Abnormal   Collection Time: 03/07/23 12:36 PM  Result Value Ref Range   Creatinine, Urine 449 mg/dL    Comment: RESULT CONFIRMED BY MANUAL DILUTION MU   Total Protein, Urine 125 mg/dL    Comment: NO NORMAL RANGE ESTABLISHED FOR THIS TEST   Protein Creatinine Ratio 0.28 (H) 0.00 - 0.15 mg/mg[Cre]    Comment: Performed at [facility], [address] [place], [place] [address]  Type and screen [facility]     Status: None   Collection Time: 03/07/23  1:07 PM  Result Value Ref Range   ABO/RH(D) A POS    Antibody Screen NEG    Sample Expiration      03/10/2023,2359 Performed at [facility], [address] [place], [place] [address]   CBC     Status: Abnormal   Collection Time: 03/08/23  6:10 AM  Result Value Ref Range   WBC 16.1 (H) 4.0 - 10.5 K/uL   RBC 4.15 3.87 - 5.11 MIL/uL   Hemoglobin 10.7 (L) 12.0 - 15.0 g/dL   HCT 46.9 (L) 62.9 - 52.8 %   MCV 78.3 (L) 80.0 - 100.0 fL   MCH 25.8 (L) 26.0 - 34.0 pg   MCHC 32.9 30.0 - 36.0 g/dL   RDW 41.3 24.4 - 01.0 %   Platelets 422 (H) 150 - 400 K/uL   nRBC 0.0 0.0 - 0.2 %    Comment: Performed at [facility], [address] [place], [place] [address]  Comprehensive metabolic panel     Status: Abnormal   Collection Time: 03/08/23  6:10 AM  Result Value Ref Range   Sodium 132 (L) 135 - 145 mmol/L   Potassium 4.0 3.5 - 5.1 mmol/L   Chloride 101 98 - 111 mmol/L   CO2 19 (L) 22 - 32 mmol/L   Glucose, Bld 125 (H) 70 - 99 mg/dL    Comment: Glucose reference range applies only to samples taken after fasting for at least 8 hours.   BUN 6 6 - 20 mg/dL   Creatinine, Ser 6.64 0.44 - 1.00 mg/dL   Calcium 8.4 (L) 8.9 - 10.3 mg/dL   Total Protein 6.7 6.5 - 8.1 g/dL   Albumin 3.1 (L) 3.5 - 5.0 g/dL   AST 27 15 - 41 U/L   ALT 27 0 - 44 U/L   Alkaline Phosphatase 139 (H) 38 - 126 U/L   Total Bilirubin 0.4 0.3 - 1.2 mg/dL   GFR, Estimated >40 >34 mL/min    Comment: (NOTE) Calculated using the CKD-EPI Creatinine Equation (2021)    Anion gap 12 5 - 15    Comment: Performed at [facility], [address] [place], [place] [address]  Comprehensive metabolic panel     Status: Abnormal   Collection Time: 03/08/23  8:19 AM  Result Value Ref Range   Sodium 133 (L) 135 - 145 mmol/L   Potassium 3.9 3.5 - 5.1 mmol/L   Chloride 103 98 - 111 mmol/L   CO2 19 (L) 22 - 32 mmol/L   Glucose, Bld 128 (H) 70 - 99  mg/dL    Comment: Glucose reference range applies only to samples taken after fasting  for at least 8 hours.   BUN 6 6 - 20 mg/dL   Creatinine, Ser 6.21 0.44 - 1.00 mg/dL   Calcium 8.1 (L) 8.9 - 10.3 mg/dL   Total Protein 6.7 6.5 - 8.1 g/dL   Albumin 3.1 (L) 3.5 - 5.0 g/dL   AST 30 15 - 41 U/L   ALT 32 0 - 44 U/L   Alkaline Phosphatase 147 (H) 38 - 126 U/L   Total Bilirubin 0.6 0.3 - 1.2 mg/dL   GFR, Estimated >30 >86 mL/min    Comment: (NOTE) Calculated using the CKD-EPI Creatinine Equation (2021)    Anion gap 11 5 - 15    Comment: Performed at [facility], [address], [place], [place] [address]  Uric acid     Status: Abnormal   Collection Time: 03/08/23  8:19 AM  Result Value Ref Range   Uric Acid, Serum 7.2 (H) 2.5 - 7.1 mg/dL    Comment: Performed at [facility], [address] [place], [place] [address]  SARS Coronavirus 2 by RT PCR (hospital order, performed in [facility] hospital lab) *cepheid single result test* Anterior Nasal Swab     Status: None   Collection Time: 03/08/23  8:19 AM   Specimen: Anterior Nasal Swab  Result Value Ref Range   SARS Coronavirus 2 by RT PCR NEGATIVE NEGATIVE    Comment: (NOTE) SARS-CoV-2 target nucleic acids are NOT DETECTED.  The SARS-CoV-2 RNA is generally detectable in upper and lower respiratory specimens during the acute phase of infection. The lowest concentration of SARS-CoV-2 viral copies this assay can detect is 250 copies / mL. A negative result does not preclude SARS-CoV-2 infection and should not be used as the sole basis for treatment or other patient management decisions.  A negative result may occur with improper specimen collection / handling, submission of specimen other than nasopharyngeal swab, presence of viral mutation(s) within the areas targeted by this assay, and inadequate number of viral copies (<250 copies / mL). A negative result must be combined with clinical observations, patient  history, and epidemiological information.  Fact Sheet for Patients:   RoadLapTop.co.za  Fact Sheet for Healthcare Providers: http://kim-miller.com/  This test is not yet approved or  cleared by the [place] FDA and has been authorized for detection and/or diagnosis of SARS-CoV-2 by FDA under an Emergency Use Authorization (EUA).  This EUA will remain in effect (meaning this test can be used) for the duration of the COVID-19 declaration under Section 564(b)(1) of the Act, 21 U.S.C. section 360bbb-3(b)(1), unless the authorization is terminated or revoked sooner.  Performed at [facility], [address] [place], [place] [address]   CBC with Differential/Platelet     Status: Abnormal   Collection Time: 03/08/23  8:33 AM  Result Value Ref Range   WBC 17.8 (H) 4.0 - 10.5 K/uL   RBC 4.23 3.87 - 5.11 MIL/uL   Hemoglobin 10.9 (L) 12.0 - 15.0 g/dL   HCT 24.4 (L) 01.0 - 27.2 %   MCV 77.8 (L) 80.0 - 100.0 fL   MCH 25.8 (L) 26.0 - 34.0 pg   MCHC 33.1 30.0 - 36.0 g/dL   RDW 53.6 64.4 - 03.4 %   Platelets 457 (H) 150 - 400 K/uL   nRBC 0.2 0.0 - 0.2 %   Neutrophils Relative % 86 %   Neutro Abs 15.2 (H) 1.7 - 7.7 K/uL   Lymphocytes Relative 9 %   Lymphs Abs 1.6 0.7 - 4.0  K/uL   Monocytes Relative 4 %   Monocytes Absolute 0.7 0.1 - 1.0 K/uL   Eosinophils Relative 0 %   Eosinophils Absolute 0.1 0.0 - 0.5 K/uL   Basophils Relative 0 %   Basophils Absolute 0.0 0.0 - 0.1 K/uL   Immature Granulocytes 1 %   Abs Immature Granulocytes 0.24 (H) 0.00 - 0.07 K/uL    Comment: Performed at [facility], [address] [place], [place] [address]  Lactic acid, plasma     Status: None   Collection Time: 03/08/23 10:41 AM  Result Value Ref Range   Lactic Acid, Venous 1.4 0.5 - 1.9 mmol/L    Comment: Performed at [facility], [address] [place], [place] [address]  Protein, urine, 24 hour     Status: Abnormal    Collection Time: 03/08/23  1:30 PM  Result Value Ref Range   Urine Total Volume-UPROT 2,000 mL   Collection Interval-UPROT 24 hours   Protein, Urine 23 mg/dL   Protein, 06T Urine 016 (H) 50 - 100 mg/day    Comment: Performed at [facility], [address] [place], [place] [address]  Type and screen [facility]     Status: None (Preliminary result)   Collection Time: 03/09/23  7:25 AM  Result Value Ref Range   ABO/RH(D) PENDING    Antibody Screen PENDING    Sample Expiration      03/12/2023,2359 Performed at [facility], [address] [place], [place] [address]   Rapid HIV screen (HIV 1/2 Ab+Ag)     Status: None   Collection Time: 03/09/23  7:25 AM  Result Value Ref Range   HIV-1 P24 Antigen - HIV24 NON REACTIVE NON REACTIVE    Comment: (NOTE) Detection of p24 may be inhibited by biotin in the sample, causing false negative results in acute infection.    HIV 1/2 Antibodies NON REACTIVE NON REACTIVE   Interpretation (HIV Ag Ab)      A non reactive test result means that HIV 1 or HIV 2 antibodies and HIV 1 p24 antigen were not detected in the specimen.    Comment: Performed at [facility], [address] [place], [place] [address]  Comprehensive metabolic panel     Status: Abnormal   Collection Time: 03/09/23  7:25 AM  Result Value Ref Range   Sodium 136 135 - 145 mmol/L   Potassium 3.8 3.5 - 5.1 mmol/L   Chloride 106 98 - 111 mmol/L   CO2 19 (L) 22 - 32 mmol/L   Glucose, Bld 101 (H) 70 - 99 mg/dL    Comment: Glucose reference range applies only to samples taken after fasting for at least 8 hours.   BUN 9 6 - 20 mg/dL   Creatinine, Ser 5.42 0.44 - 1.00 mg/dL   Calcium 8.0 (L) 8.9 - 10.3 mg/dL   Total Protein 6.5 6.5 - 8.1 g/dL   Albumin 3.1 (L) 3.5 - 5.0 g/dL   AST 48 (H) 15 - 41 U/L   ALT 62 (H) 0 - 44 U/L   Alkaline Phosphatase 144 (H) 38 - 126 U/L   Total Bilirubin 0.9 0.3 - 1.2 mg/dL   GFR, Estimated >70 >62  mL/min    Comment: (NOTE) Calculated using the CKD-EPI Creatinine Equation (2021)    Anion gap 11 5 - 15    Comment: Performed at [facility], [address], [place], [place] [address]  CBC     Status:  Abnormal   Collection Time: 03/09/23  7:25 AM  Result Value Ref Range   WBC 17.1 (H) 4.0 - 10.5 K/uL   RBC 4.15 3.87 - 5.11 MIL/uL   Hemoglobin 10.6 (L) 12.0 - 15.0 g/dL   HCT 16.1 (L) 09.6 - 04.5 %   MCV 80.0 80.0 - 100.0 fL   MCH 25.5 (L) 26.0 - 34.0 pg   MCHC 31.9 30.0 - 36.0 g/dL   RDW 40.9 81.1 - 91.4 %   Platelets 491 (H) 150 - 400 K/uL   nRBC 0.2 0.0 - 0.2 %    Comment: Performed at [facility], [address], [place], [place] [address]   US FETAL BPP WO NON STRESS  Result Date: 03/08/2023 CLINICAL DATA:  Preeclampsia. EXAM: BIOPHYSICAL PROFILE FINDINGS: Number of Fetuses: 1 Heart Rate: 141 bpm Presentation: Cephalic Movement: 2 time: 10 minutes Breathing: 2 Tone: 2 Amniotic Fluid: 2 Total Score: 8/8 IMPRESSION: Normal biophysical profile. Electronically Signed   By: Layla Maw M.D.   On: 03/08/2023 11:11    Current scheduled medications  labetalol  200 mg Oral BID   NIFEdipine  60 mg Oral q AM   NIFEdipine  60 mg Oral Nightly   ondansetron (ZOFRAN) IV  4 mg Intravenous Q6H   prenatal multivitamin  1 tablet Oral Q breakfast   sodium chloride flush  10 mL Intravenous Q12H   I have reviewed the patient's current medications.  ASSESSMENT: 32yo G3P2002 at [redacted]w[redacted]d by 6wk [place] admitted for SIPE on cHTN with Sfs by BPs, now s/p BMZ x 2, will be steroid-complete at 1330 this afternoon, and continues on Magnesium gtt. Pt's Bps controlled with Nifedipine 60mg  BID, Labetalol 200mg  BID, and continues to require IV Labetalol and Hydralazine frequently. Decision made yesterday with MFM to proceed with cesarean delivery this afternoon at 1330 once steroid-complete. NICU consult completed yesterday. Pregnancy also c/b BMI of 45, MJ/tobacco use during pregnancy, and  hx of bipolar disorder.  -Today's PIH panel:  AST: 48 < 30  ALT: 62 < 32  Cr: 0.51 < 0.54  PLT: 491 < 457  PLAN: -Cesarean delivery with BTL at 1330 today, pre-op orders in. Informed consents done for CD and BTL.  -Continue Mg gtt 24hrs post-op -Continue daily PIH labs post-op   Peggye Form [facility]

## 2023-03-10 ENCOUNTER — Encounter: Payer: Self-pay | Admitting: Obstetrics

## 2023-03-10 DIAGNOSIS — O9903 Anemia complicating the puerperium: Secondary | ICD-10-CM | POA: Diagnosis not present

## 2023-03-10 DIAGNOSIS — D62 Acute posthemorrhagic anemia: Secondary | ICD-10-CM | POA: Diagnosis not present

## 2023-03-10 LAB — COMPREHENSIVE METABOLIC PANEL
ALT: 106 U/L — ABNORMAL HIGH (ref 0–44)
AST: 67 U/L — ABNORMAL HIGH (ref 15–41)
Albumin: 2.6 g/dL — ABNORMAL LOW (ref 3.5–5.0)
Alkaline Phosphatase: 113 U/L (ref 38–126)
Anion gap: 7 (ref 5–15)
BUN: 14 mg/dL (ref 6–20)
CO2: 25 mmol/L (ref 22–32)
Calcium: 7.3 mg/dL — ABNORMAL LOW (ref 8.9–10.3)
Chloride: 101 mmol/L (ref 98–111)
Creatinine, Ser: 0.57 mg/dL (ref 0.44–1.00)
GFR, Estimated: >60 mL/min (ref >60)
Glucose, Bld: 88 mg/dL (ref 70–99)
Potassium: 4.2 mmol/L (ref 3.5–5.1)
Sodium: 133 mmol/L — ABNORMAL LOW (ref 135–145)
Total Bilirubin: 0.5 mg/dL (ref 0.3–1.2)
Total Protein: 5.3 g/dL — ABNORMAL LOW (ref 6.5–8.1)

## 2023-03-10 LAB — SURGICAL PATHOLOGY

## 2023-03-10 LAB — CBC
HCT: 22.8 % — ABNORMAL LOW (ref 36.0–46.0)
Hemoglobin: 7.5 g/dL — ABNORMAL LOW (ref 12.0–15.0)
MCH: 26 pg (ref 26.0–34.0)
MCHC: 32.9 g/dL (ref 30.0–36.0)
MCV: 78.9 fL — ABNORMAL LOW (ref 80.0–100.0)
Platelets: 346 10*3/uL (ref 150–400)
RBC: 2.89 MIL/uL — ABNORMAL LOW (ref 3.87–5.11)
RDW: 15 % (ref 11.5–15.5)
WBC: 18.5 10*3/uL — ABNORMAL HIGH (ref 4.0–10.5)
nRBC: 0 % (ref 0.0–0.2)

## 2023-03-10 LAB — SYPHILIS: RPR W/REFLEX TO RPR TITER AND TREPONEMAL ANTIBODIES, TRADITIONAL SCREENING AND DIAGNOSIS ALGORITHM: RPR Ser Ql: NONREACTIVE

## 2023-03-10 MED ORDER — MENTHOL 3 MG MT LOZG
1.0000 | LOZENGE | OROMUCOSAL | Status: DC | PRN
Start: 2023-03-10 — End: 2023-03-13

## 2023-03-10 MED ORDER — WITCH HAZEL-GLYCERIN EX PADS
1.0000 | MEDICATED_PAD | CUTANEOUS | Status: DC | PRN
Start: 2023-03-10 — End: 2023-03-13

## 2023-03-10 MED ORDER — DIPHENHYDRAMINE HCL 25 MG PO CAPS: 25.0000 mg | ORAL_CAPSULE | Freq: Four times a day (QID) | ORAL | Status: DC | PRN

## 2023-03-10 MED ORDER — PRENATAL MULTIVITAMIN CH
1.0000 | ORAL_TABLET | Freq: Every day | ORAL | Status: DC
Start: 2023-03-10 — End: 2023-03-13
  Administered 2023-03-10 – 2023-03-12 (×3): 1 via ORAL
  Filled 2023-03-10 (×3): qty 1

## 2023-03-10 MED ORDER — DIBUCAINE (PERIANAL) 1 % EX OINT: 1.0000 | TOPICAL_OINTMENT | CUTANEOUS | Status: DC | PRN

## 2023-03-10 MED ORDER — ENOXAPARIN SODIUM 40 MG/0.4ML IJ SOSY
40.0000 mg | PREFILLED_SYRINGE | INTRAMUSCULAR | Status: DC
  Administered 2023-03-10 – 2023-03-11 (×2): 40 mg via SUBCUTANEOUS
  Filled 2023-03-10 (×3): qty 0.4

## 2023-03-10 MED ORDER — SENNOSIDES-DOCUSATE SODIUM 8.6-50 MG PO TABS
2.0000 | ORAL_TABLET | Freq: Every day | ORAL | Status: DC
  Administered 2023-03-10 – 2023-03-12 (×2): 2 via ORAL
  Filled 2023-03-10 (×3): qty 2

## 2023-03-10 MED ORDER — COCONUT OIL OIL: 1.0000 | TOPICAL_OIL | Status: DC | PRN

## 2023-03-10 NOTE — Anesthesia Postprocedure Evaluation (Signed)
Anesthesia Post Note  Patient: Paula Keller  Procedure(s) Performed: CESAREAN SECTION  Patient location during evaluation: L&D Anesthesia Type: Spinal Level of consciousness: awake and alert and oriented Pain management: pain level controlled Vital Signs Assessment: post-procedure vital signs reviewed and stable Respiratory status: respiratory function stable Cardiovascular status: stable Postop Assessment: no headache, no backache, no apparent nausea or vomiting, patient able to bend at knees, able to ambulate and adequate PO intake Anesthetic complications: no   No notable events documented.   Last Vitals:  Vitals:   03/10/23 1017 03/10/23 1030  BP: 139/72   Pulse: 81   Resp:  18  Temp:    SpO2:      Last Pain:  Vitals:   03/10/23 0819  TempSrc: Oral  PainSc: 3                  Zachary George

## 2023-03-10 NOTE — Progress Notes (Signed)
Postpartum/PostOperative Progress Note Subjective:  32 y.o. Paula Keller post-operative day # 1 status post primary cesarean delivery at [redacted]w[redacted]d for chronic hypertension with superimposed preeclampsia with severe features.  Pt's pain is well-controlled. Tolerating PO without N/V, continues on Magnesium, so is not ambulating without assistance yet, Foley still in place, and has not stooled. Is pumping for baby in NICU. After bleeding/vasovagal event last night, EBL overnight reported as 190cc clot, all other appropriate, and 2nd Pitocin bag still running.  Denies HA, scotomata, RUQ pain, dizziness, dyspnea, palpitations.    Medications SCHEDULED MEDICATIONS   acetaminophen  1,000 mg Oral Q6H   ketorolac  30 mg Intravenous Q6H   Followed by   Melene Muller ON 03/11/2023] ibuprofen  600 mg Oral Q6H   labetalol  200 mg Oral BID   magnesium  2 g Intravenous Once   NIFEdipine  60 mg Oral q AM   NIFEdipine  60 mg Oral Nightly   scopolamine  1 patch Transdermal Once   simethicone  80 mg Oral TID PC    MEDICATION INFUSIONS   lactated ringers 10 mL/hr at 03/09/23 1930   magnesium sulfate 2 g/hr (03/10/23 0630)   naloxone HCl (NARCAN) 2 mg in dextrose 5 % 250 mL infusion     oxytocin 2.5 Units/hr (03/09/23 1500)   promethazine (PHENERGAN) injection (IM or IVPB)      PRN MEDICATIONS  albuterol, calcium carbonate, diphenhydrAMINE **OR** diphenhydrAMINE, hydrALAZINE **AND** hydrALAZINE **AND** labetalol **AND** labetalol **AND** Measure blood pressure, ketorolac **OR** ketorolac, loperamide, meperidine (DEMEROL) injection, metoCLOPramide (REGLAN) injection, naloxone **AND** sodium chloride flush, naloxone HCl (NARCAN) 2 mg in dextrose 5 % 250 mL infusion, ondansetron (ZOFRAN) IV, oxyCODONE, promethazine (PHENERGAN) injection (IM or IVPB), simethicone    Objective:   Vitals:   03/10/23 0445 03/10/23 0530 03/10/23 0630 03/10/23 0653  BP: 138/73 (!) 149/74 (!) 161/89 (!) 153/87  Pulse: 71 73 74 78  Resp:  16 18    Temp:      TempSrc:      SpO2: 99% 99% 98% 99%  Weight:      Height:        Current Vital Signs 24h Vital Sign Ranges  T 98.2 F (36.8 C) Temp  Avg: 98.1 F (36.7 C)  Min: 97.7 F (36.5 C)  Max: 98.6 F (37 C)  BP (!) 153/87 BP  Min: 123/63  Max: 161/89  HR 78 Pulse  Avg: 83.4  Min: 68  Max: 102  RR 18 Resp  Avg: 17.1  Min: 16  Max: 18  SaO2 99 % Room Air SpO2  Avg: 96.7 %  Min: 93 %  Max: 100 %       24 Hour I/O Current Shift I/O  Time Ins Outs 10/24 0701 - 10/25 0700 In: 3598.9 [P.O.:960; I.V.:2538.9] Out: 4210 [Urine:2400] No intake/output data recorded.   PHYSICAL EXAM: GEN: NAD RESP: NWOB, CTAB CV: RRR, S1, S2 ABD: Fundus at U-1, firm, non-tender / Dressing clean and dry  EXT: Trace BLE edema / No calf tenderness or redness / +2/2 DTRs, no clonus / SCDs  LABS:  Recent Labs  Lab 03/09/23 0725 03/09/23 1953 03/10/23 0648  WBC 17.1* 19.4* 18.5*  HGB 10.6* 8.2* 7.5*  HCT 33.2* 25.1* 22.8*  PLT 491* 399 346    Assessment & Plan:   32 y.o. [id number] status post primary cesarean section with BTL at [redacted]w[redacted]d for SIPE on cHTN with SF by BPs, now POD#1. 1) Meeting early milestones, doing well. Continue routine post-op care.  No independent ambulation while still on Magnesium.  2) PNL: A POS / Rubella immune  / Varicella Immune 4) Acute blood loss anemia - H/H POD#1 = 7.5 from 11.3 on admission. Hemodynamically stable and asymptomatic this morning. EBL S/p Pitocin x 2, Hemabate x 1, Cytotec 800 PR, and TXA x 1.  - Plan for IV infusion tomorrow, after Magnesium off.  4) breast and bottle /Contraception = bilateral tubal ligation 5) SIPE: AM PIH labs today with AST/ALT still rising: 67/106 < 48/62. Cr stable (0.57<0.51). PLT stable (346 < 399) and UOP appropriate.  -Continue daily PIH labs, ordered.  -Magnesium off at 1430 today (24hrs), Foley out when Mag off.  -Treat severe range BP =<160/110 with IV; treat =<150/100 with PO. If normotensive, may hold anti-HTN meds.    Anticipate DC home POD #3-4 pending clinical status of SIPE.  Julieanne Manson, MD 03/10/23 8:00 AM

## 2023-03-10 NOTE — Lactation Note (Signed)
This note was copied from a baby's chart. Lactation Consultation Note  Patient Name: Boy Ladonne Lietzke Krisko [id number] Date: 03/10/2023 Age:32 hours Reason for consult: L&D Initial assessment;NICU baby;Preterm <34wks;Infant < 6lbs   Maternal Data Has patient been taught Hand Expression?: Yes Does the patient have breastfeeding experience prior to this delivery?: Yes How long did the patient breastfeed?: 1 month  Lactation to room for a initial assessment w/ a P3 patient and a 24hr old baby boy.  Infant born at [redacted]w[redacted]d in [place].  This was a c-section delivery.  Patient just came off magnesium.    Patient stated that her feeding goal is breastfeeding.  She only breastfed her other children for 1 month.  Patient currently has just a manual pump at home.  Insurance informed her that they would not send a pump until infant is at least 34w.   Feeding Mother's Current Feeding Choice: Breast Milk  Interventions Interventions: Breast feeding basics reviewed  Patient is pumping.  LC observed a pumping session during this visit.  Patient is discouraged because she is not seeing milk.  LC provided education on milk production expectations and the importance of pumping 8-12x w/in a 24hr period.  Patient verbalized understanding.    LC did fax a breastpump referral to [facility] (twice).  Discharge Pump: Manual;Personal WIC Program: Yes  Consult Status Consult Status: Follow-up Follow-up type: In-patient    Yvette Rack Free 03/10/2023, 3:24 PM

## 2023-03-11 DIAGNOSIS — O99345 Other mental disorders complicating the puerperium: Secondary | ICD-10-CM | POA: Diagnosis not present

## 2023-03-11 DIAGNOSIS — O1423 HELLP syndrome (HELLP), third trimester: Secondary | ICD-10-CM | POA: Diagnosis not present

## 2023-03-11 DIAGNOSIS — D62 Acute posthemorrhagic anemia: Secondary | ICD-10-CM | POA: Diagnosis not present

## 2023-03-11 DIAGNOSIS — O9903 Anemia complicating the puerperium: Secondary | ICD-10-CM | POA: Diagnosis not present

## 2023-03-11 DIAGNOSIS — F319 Bipolar disorder, unspecified: Secondary | ICD-10-CM | POA: Diagnosis not present

## 2023-03-11 LAB — CBC
HCT: 22.4 % — ABNORMAL LOW (ref 36.0–46.0)
Hemoglobin: 7.3 g/dL — ABNORMAL LOW (ref 12.0–15.0)
MCH: 26.1 pg (ref 26.0–34.0)
MCHC: 32.6 g/dL (ref 30.0–36.0)
MCV: 80 fL (ref 80.0–100.0)
Platelets: 332 10*3/uL (ref 150–400)
RBC: 2.8 MIL/uL — ABNORMAL LOW (ref 3.87–5.11)
RDW: 15.1 % (ref 11.5–15.5)
WBC: 14.5 10*3/uL — ABNORMAL HIGH (ref 4.0–10.5)
nRBC: 0 % (ref 0.0–0.2)

## 2023-03-11 LAB — COMPREHENSIVE METABOLIC PANEL
ALT: 140 U/L — ABNORMAL HIGH (ref 0–44)
AST: 76 U/L — ABNORMAL HIGH (ref 15–41)
Albumin: 2.8 g/dL — ABNORMAL LOW (ref 3.5–5.0)
Alkaline Phosphatase: 114 U/L (ref 38–126)
Anion gap: 8 (ref 5–15)
BUN: 13 mg/dL (ref 6–20)
CO2: 24 mmol/L (ref 22–32)
Calcium: 8.5 mg/dL — ABNORMAL LOW (ref 8.9–10.3)
Chloride: 105 mmol/L (ref 98–111)
Creatinine, Ser: 0.58 mg/dL (ref 0.44–1.00)
GFR, Estimated: >60 mL/min (ref >60)
Glucose, Bld: 80 mg/dL (ref 70–99)
Potassium: 4 mmol/L (ref 3.5–5.1)
Sodium: 137 mmol/L (ref 135–145)
Total Bilirubin: 0.4 mg/dL (ref 0.3–1.2)
Total Protein: 5.7 g/dL — ABNORMAL LOW (ref 6.5–8.1)

## 2023-03-11 LAB — BASIC METABOLIC PANEL
Anion gap: 8 (ref 5–15)
BUN: 14 mg/dL (ref 6–20)
CO2: 25 mmol/L (ref 22–32)
Calcium: 8.6 mg/dL — ABNORMAL LOW (ref 8.9–10.3)
Chloride: 106 mmol/L (ref 98–111)
Creatinine, Ser: 0.65 mg/dL (ref 0.44–1.00)
GFR, Estimated: >60 mL/min (ref >60)
Glucose, Bld: 82 mg/dL (ref 70–99)
Potassium: 4 mmol/L (ref 3.5–5.1)
Sodium: 139 mmol/L (ref 135–145)

## 2023-03-11 MED ORDER — SERTRALINE HCL 25 MG PO TABS
50.0000 mg | ORAL_TABLET | Freq: Every day | ORAL | Status: DC
  Administered 2023-03-11 – 2023-03-12 (×2): 50 mg via ORAL
  Filled 2023-03-11 (×2): qty 2

## 2023-03-11 MED ORDER — PNEUMOCOCCAL 20-VAL CONJ VACC 0.5 ML IM SUSY
0.5000 mL | PREFILLED_SYRINGE | INTRAMUSCULAR | Status: AC
  Administered 2023-03-12: 0.5 mL via INTRAMUSCULAR
  Filled 2023-03-11 (×3): qty 0.5

## 2023-03-11 NOTE — Progress Notes (Addendum)
Progress Note - Cesarean Delivery  Paula Keller is a 32 y.o. [phone] now PP day 2 s/p C-Section, Low Transverse.   Subjective:  Patient reports no problems with eating, bowel movements, voiding, or their wound    Objective:  Vital signs in last 24 hours: Temp:  [97.9 F (36.6 C)-99 F (37.2 C)] 98.3 F (36.8 C) (10/26 0804) Pulse Rate:  [81-105] 94 (10/26 0804) Resp:  [16-18] 18 (10/26 0804) BP: (123-160)/(66-99) 131/71 (10/26 0804) SpO2:  [95 %-98 %] 98 % (10/26 0804)  Physical Exam:  General: alert, cooperative, appears stated age, and mild distress Lochia: appropriate Uterine Fundus: firm Incision: healing well, no significant drainage, no dehiscence    Data Review Recent Labs    03/10/23 0648 03/11/23 0536  HGB 7.5* 7.3*  HCT 22.8* 22.4*    Assessment:  Active Problems:   Bipolar I disorder, current or most recent episode depressed, with psychotic features (HCC)   Tobacco use disorder   Chronic hypertension affecting pregnancy   Supervision of high-risk pregnancy     Overview:      Nursing Staff Provider      Office Location [place] [facility] Dating  By 6w u/s      Language  English Anatomy US  By MFM, complete, placenta posterior      Flu Vaccine  declined Genetic Screen  NIPS: negative, female      TDaP vaccine   02/21/23 Hgb A1C or      GTT Early : 170, Hgb A1c 5.8, passed 3h     Third trimester : 3hgtt      Covid    LAB RESULTS       Rhogam   Blood Type --/--/A POS     Performed at [facility], [address] [place], [place]      [address]      (06/28 2015)       Feeding Plan Plans breast Antibody negative      Contraception  Rubella immune      Circumcision  RPR Non-reactive       Pediatrician   HBsAg negative       Support Person Chris HIV negative      Prenatal Classes  Varicella immune        GBS  (For PCN allergy, check sensitivities)       BTL Consent         VBAC Consent NA Pap Negative on 10/18/22        Hgb Electro         Pelvis Tested  CF          SMA                      [ ]  Aspirin 81 mg daily after 12 weeks; discontinue after 36 weeks     [ ]  baseline labs with CBC, CMP, urine protein/creatinine ratio     [ ]  no BP meds unless BPs become elevated     [ ]  ultrasound for growth at 28, 32, 36 weeks     [ ]  Aspirin 81 mg daily after 12 weeks; discontinue after 36 weeks     [ ]  Baseline EKG           Current antihypertensives:  Procardia XL 60 mg          Baseline and surveillance labs (pulled in from EPIC, refresh links as      needed)  Lab Results      Component Value Date       PLT 440 (H) 12/20/2022       CREATININE 0.66 12/20/2022       AST 24 12/20/2022       ALT 35 12/20/2022                Antenatal Testing     CHTN - O10.919      Group I  BP < 140/90, no preeclampsia, AGA,  nml AFV, +/- meds            Group II BP > 140/90, on meds, no preeclampsia, AGA, nml AFV      20-28-34-38          20-24-28-32-35-38      32//2 x wk          28//BPP wkly then 32//2 x wk      40 no meds; 39 meds          PRN or 37      Pre-eclampsia      GHTN - O13.9/Preeclampsia without severe features  - O14.00           Preeclampsia with severe features - O14.10      Q 3-4wks          Q 2 wks      28//BPP wkly then 32//2 x wk          Inpatient      37          PRN or 34          Obesity affecting pregnancy   Marijuana use during pregnancy   Chronic hypertension with superimposed preeclampsia   Status post Cesarean section. Doing well postoperatively.        Plan:       Continue current care. Pt states she has a history of depression and she has taken medications in the past. She is worried about developing postpartum depression. She is interested in starting medication at this time . Her Edinburgh score is elevated at this time CSW consult ordered. Zoloft 50 mg started.   Plan for discharge tomorrow evening  Doreene Burke, [place]  03/11/2023 9:06 AM

## 2023-03-11 NOTE — Lactation Note (Signed)
This note was copied from a baby's chart. Lactation Consultation Note  Patient Name: Paula Keller GEXBM'W Date: 03/11/2023 Age:32 hours Reason for consult: Follow-up assessment;NICU baby;Preterm <34wks   Maternal Data Has patient been taught Hand Expression?: Yes Does the patient have breastfeeding experience prior to this delivery?: Yes How long did the patient breastfeed?: 1 mth  Feeding Mother's Current Feeding Choice: Breast Milk and Donor Milk Baby is not going to the breast currently LATCH Score                    Lactation Tools Discussed/Used Tools: Pump Breast pump type: Double-Electric Breast Pump Reason for Pumping: baby in SCN Pumping frequency: encouraged to pump breasts 8x/24 hrs Pumped volume:  (drops per pump, 1 ml with hand expression)  Interventions Interventions: DEBP Encouragement given LC name and no written on white board Discharge Pump: DEBP;WIC Loaner (mom states WIC called her and she can obtain a pump from [facility] on Monday, 03/13/2023) WIC Program: Yes  Consult Status Consult Status: PRN Date: 03/11/23 Follow-up type: In-patient    Dyann Kief 03/11/2023, 10:52 AM

## 2023-03-11 NOTE — Lactation Note (Signed)
This note was copied from a baby's chart. Mom reports obtaining 5 cc colostrum at last pumping session and is encouraged.

## 2023-03-11 NOTE — Clinical Social Work Maternal (Signed)
CLINICAL SOCIAL WORK MATERNAL/CHILD NOTE  Patient Details  Name: Paula Keller MRN: [id number] Date of Birth: 02/01/1991  Date:  August 12, 2022  Clinical Social Worker Initiating Note:  Phoebe Marter Date/Time: Initiated:  17-May-2022/      Child's Name:  Paula Keller   Biological Parents:  Mother, Father   Need for Interpreter:  None   Reason for Referral:  Other (Comment) Inocente Salles Score, SCN admit)   Address:  [address] [place] [address]    Phone number:  [phone] (home)     Additional phone number:   Household Members/Support Persons (HM/SP):   Household Member/Support Person 1, Household Member/Support Person 2, Household Member/Support Person 3   HM/SP Name Relationship DOB or Age  HM/SP -1 Chris Long FOB unknown  HM/SP -2 Selena Shedd daughter 9 years old  HM/SP -3 Jase Foust son 23 years old  HM/SP -4        HM/SP -5        HM/SP -6        HM/SP -7        HM/SP -8          Natural Supports (not living in the home):  Immediate Family, Extended Family, Friends   Herbalist:     Employment:     Type of Work:     Education:      Homebound arranged:    Surveyor, quantity Resources:  Medicaid   Other Resources:  Sales executive  , WIC   Cultural/Religious Considerations Which May Impact Care:    Strengths:  Ability to meet basic needs  , Compliance with medical plan  , Home prepared for child  , Understanding of illness   Psychotropic Medications:         Pediatrician:       Pediatrician List:   Radiographer, therapeutic    [facility]      Pediatrician Fax Number:    Risk Factors/Current Problems:      Cognitive State:  Alert  , Insightful     Mood/Affect:  Calm     CSW Assessment:  CSW received a consult for Edinburgh Score and Baby being admitted to [facility].  CSW spoke with RN prior to meeting with MOB. Per RN, there are no additional concerns at this  time.   CSW met with MOB at bedside. Explained HIPPA and MOB elected for FOB Thayer Ohm to remain at bedside during assessment. Explained CSW's role and reason for referral.  MOB reported she is feeling good overall post delivery. MOB is sad about Baby being in SCN, but is coping well at this time overall.  MOB wasalert/appropriate during assessment.   Confirmed contact information for MOB. MOB and Baby will be living with FOB and her other 2 children at discharge.   MOB reported she receives WIC/Food Veterinary surgeon and will inform her Workers of Baby's birth. MOB is still deciding on a Pediatrician for [facility]. MOB denied resource needs at this time.   MOB reported she has a history of anxiety/depression. MOB reported she saw a counselor in the past. MOB stated she has spoken with her Medical Provider about [facility] and PPD and is being started on Zoloft. MOB reported she has a good support system and is coping well emotionally at this time. MOB denied SI, HI, or DV. MOB denied the need for mental health  CLINICAL SOCIAL WORK MATERNAL/CHILD NOTE  Patient Details  Name: Paula Keller MRN: [id number] Date of Birth: 08/06/1990  Date:  August 12, 2022  Clinical Social Worker Initiating Note:  Phoebe Marter Date/Time: Initiated:  17-May-2022/      Child's Name:  Paula Keller   Biological Parents:  Mother, Father   Need for Interpreter:  None   Reason for Referral:  Other (Comment) Inocente Salles Score, SCN admit)   Address:  [address] [place] [address]    Phone number:  [phone] (home)     Additional phone number:   Household Members/Support Persons (HM/SP):   Household Member/Support Person 1, Household Member/Support Person 2, Household Member/Support Person 3   HM/SP Name Relationship DOB or Age  HM/SP -1 Chris Long FOB unknown  HM/SP -2 Selena Shedd daughter 9 years old  HM/SP -3 Jase Foust son 23 years old  HM/SP -4        HM/SP -5        HM/SP -6        HM/SP -7        HM/SP -8          Natural Supports (not living in the home):  Immediate Family, Extended Family, Friends   Herbalist:     Employment:     Type of Work:     Education:      Homebound arranged:    Surveyor, quantity Resources:  Medicaid   Other Resources:  Sales executive  , WIC   Cultural/Religious Considerations Which May Impact Care:    Strengths:  Ability to meet basic needs  , Compliance with medical plan  , Home prepared for child  , Understanding of illness   Psychotropic Medications:         Pediatrician:       Pediatrician List:   Radiographer, therapeutic    [facility]      Pediatrician Fax Number:    Risk Factors/Current Problems:      Cognitive State:  Alert  , Insightful     Mood/Affect:  Calm     CSW Assessment:  CSW received a consult for Edinburgh Score and Baby being admitted to [facility].  CSW spoke with RN prior to meeting with MOB. Per RN, there are no additional concerns at this  time.   CSW met with MOB at bedside. Explained HIPPA and MOB elected for FOB Thayer Ohm to remain at bedside during assessment. Explained CSW's role and reason for referral.  MOB reported she is feeling good overall post delivery. MOB is sad about Baby being in SCN, but is coping well at this time overall.  MOB wasalert/appropriate during assessment.   Confirmed contact information for MOB. MOB and Baby will be living with FOB and her other 2 children at discharge.   MOB reported she receives WIC/Food Veterinary surgeon and will inform her Workers of Baby's birth. MOB is still deciding on a Pediatrician for [facility]. MOB denied resource needs at this time.   MOB reported she has a history of anxiety/depression. MOB reported she saw a counselor in the past. MOB stated she has spoken with her Medical Provider about [facility] and PPD and is being started on Zoloft. MOB reported she has a good support system and is coping well emotionally at this time. MOB denied SI, HI, or DV. MOB denied the need for mental health

## 2023-03-12 DIAGNOSIS — F319 Bipolar disorder, unspecified: Secondary | ICD-10-CM | POA: Diagnosis not present

## 2023-03-12 DIAGNOSIS — O99324 Drug use complicating childbirth: Secondary | ICD-10-CM

## 2023-03-12 DIAGNOSIS — F1290 Cannabis use, unspecified, uncomplicated: Secondary | ICD-10-CM

## 2023-03-12 DIAGNOSIS — O99214 Obesity complicating childbirth: Secondary | ICD-10-CM | POA: Diagnosis not present

## 2023-03-12 DIAGNOSIS — Z3A30 30 weeks gestation of pregnancy: Secondary | ICD-10-CM

## 2023-03-12 DIAGNOSIS — E669 Obesity, unspecified: Secondary | ICD-10-CM

## 2023-03-12 DIAGNOSIS — O99344 Other mental disorders complicating childbirth: Secondary | ICD-10-CM | POA: Diagnosis not present

## 2023-03-12 DIAGNOSIS — F17210 Nicotine dependence, cigarettes, uncomplicated: Secondary | ICD-10-CM

## 2023-03-12 DIAGNOSIS — O114 Pre-existing hypertension with pre-eclampsia, complicating childbirth: Secondary | ICD-10-CM | POA: Diagnosis not present

## 2023-03-12 DIAGNOSIS — O99334 Smoking (tobacco) complicating childbirth: Secondary | ICD-10-CM

## 2023-03-12 LAB — COMPREHENSIVE METABOLIC PANEL
ALT: 100 U/L — ABNORMAL HIGH (ref 0–44)
AST: 48 U/L — ABNORMAL HIGH (ref 15–41)
Albumin: 2.7 g/dL — ABNORMAL LOW (ref 3.5–5.0)
Alkaline Phosphatase: 110 U/L (ref 38–126)
Anion gap: 7 (ref 5–15)
BUN: 8 mg/dL (ref 6–20)
CO2: 26 mmol/L (ref 22–32)
Calcium: 8.7 mg/dL — ABNORMAL LOW (ref 8.9–10.3)
Chloride: 108 mmol/L (ref 98–111)
Creatinine, Ser: 0.56 mg/dL (ref 0.44–1.00)
GFR, Estimated: >60 mL/min (ref >60)
Glucose, Bld: 87 mg/dL (ref 70–99)
Potassium: 4.1 mmol/L (ref 3.5–5.1)
Sodium: 141 mmol/L (ref 135–145)
Total Bilirubin: 0.4 mg/dL (ref 0.3–1.2)
Total Protein: 5.7 g/dL — ABNORMAL LOW (ref 6.5–8.1)

## 2023-03-12 LAB — PREPARE RBC (CROSSMATCH)

## 2023-03-12 LAB — CBC
HCT: 20.4 % — ABNORMAL LOW (ref 36.0–46.0)
Hemoglobin: 6.5 g/dL — ABNORMAL LOW (ref 12.0–15.0)
MCH: 26 pg (ref 26.0–34.0)
MCHC: 31.9 g/dL (ref 30.0–36.0)
MCV: 81.6 fL (ref 80.0–100.0)
Platelets: 313 10*3/uL (ref 150–400)
RBC: 2.5 MIL/uL — ABNORMAL LOW (ref 3.87–5.11)
RDW: 14.9 % (ref 11.5–15.5)
WBC: 13.2 10*3/uL — ABNORMAL HIGH (ref 4.0–10.5)
nRBC: 0.6 % — ABNORMAL HIGH (ref 0.0–0.2)

## 2023-03-12 MED ORDER — NIFEDIPINE ER 60 MG PO TB24: 60.0000 mg | ORAL_TABLET | Freq: Every morning | ORAL | 3 refills | Status: AC

## 2023-03-12 MED ORDER — IBUPROFEN 600 MG PO TABS: 600.0000 mg | ORAL_TABLET | Freq: Four times a day (QID) | ORAL | Status: AC | PRN

## 2023-03-12 MED ORDER — IRON SUCROSE 500 MG IVPB - SIMPLE MED
500.0000 mg | Freq: Once | INTRAVENOUS | Status: AC
  Administered 2023-03-12: 500 mg via INTRAVENOUS
  Filled 2023-03-12: qty 500

## 2023-03-12 MED ORDER — SODIUM CHLORIDE 0.9% IV SOLUTION: Freq: Once | INTRAVENOUS | Status: AC

## 2023-03-12 MED ORDER — SERTRALINE HCL 50 MG PO TABS: 50.0000 mg | ORAL_TABLET | Freq: Every day | ORAL | 3 refills | Status: AC

## 2023-03-12 MED ORDER — ACETAMINOPHEN 500 MG PO TABS: 1000.0000 mg | ORAL_TABLET | Freq: Four times a day (QID) | ORAL | Status: AC

## 2023-03-12 MED ORDER — NIFEDIPINE ER 60 MG PO TB24: 60.0000 mg | ORAL_TABLET | Freq: Every evening | ORAL | 3 refills | Status: AC

## 2023-03-12 MED ORDER — LABETALOL HCL 200 MG PO TABS: 200.0000 mg | ORAL_TABLET | Freq: Two times a day (BID) | ORAL | 2 refills | Status: AC

## 2023-03-12 MED ORDER — EPINEPHRINE PF 1 MG/ML IJ SOLN: 0.3000 mg | Freq: Once | INTRAMUSCULAR | Status: DC | PRN

## 2023-03-12 MED ORDER — SODIUM CHLORIDE 0.9 % IV SOLN: INTRAVENOUS | Status: DC | PRN

## 2023-03-12 MED ORDER — SODIUM CHLORIDE 0.9 % IV BOLUS: 500.0000 mL | Freq: Once | INTRAVENOUS | Status: DC | PRN

## 2023-03-12 NOTE — Progress Notes (Signed)
RN notified me of a near syncopal episode this morning after returning from [facility]. Repeat cbc ordered.      Latest Ref Rng & Units 03/12/2023    6:59 AM 03/11/2023    5:36 AM 03/10/2023    6:48 AM  CBC  WBC 4.0 - 10.5 K/uL 13.2  14.5  18.5   Hemoglobin 12.0 - 15.0 g/dL 6.5  7.3  7.5   Hematocrit 36.0 - 46.0 % 20.4  22.4  22.8   Platelets 150 - 400 K/uL 313  332  346    Her Hgb and hCT have decreased and she is symptomatic. Dr. Valentino Saxon consulted. 1 unit RBCs ordered. Pt agreeable to plan of care.   Doreene Burke, CNM

## 2023-03-12 NOTE — Progress Notes (Signed)
Spoke with Doreene Burke CNM on the phone regarding patient's change in status. Patient called out after ambulating to SCN to see her baby. Reports feeling faint but made it back to her bed before she actually had a syncopal episode. Performed orthostatic pressures on her. No significant change, negative for orthostatics. No new PIH symptoms. Bps elevated but not severe range.   CNM ordered CMP/CBC and IV iron infusion. Patient updated on plan of care.

## 2023-03-12 NOTE — Progress Notes (Signed)
All discharge instructions reviewed with pt; iv removed; procardia and labetalol given tonight prior to discharge per CNM verbal order; discharge papers given to pt; pt discharged via wheelchair escorted by nurse tech; pt going home

## 2023-03-12 NOTE — Discharge Instructions (Signed)

## 2023-03-12 NOTE — Discharge Summary (Signed)
Postpartum Discharge Summary  Date of Service updated10/27/2024     Patient Name: Paula Keller DOB: 1990/06/22 MRN: [id number]  Date of admission: 03/07/2023 Delivery date:03/09/2023 Delivering provider: Julieanne Manson Date of discharge: 03/12/2023  Admitting diagnosis: Elevated blood pressure reading [R03.0] Preeclampsia, severe [O14.10] Intrauterine pregnancy: [redacted]w[redacted]d     Secondary diagnosis:  Active Problems:   Bipolar I disorder, current or most recent episode depressed, with psychotic features (HCC)   Tobacco use disorder   Supervision of high-risk pregnancy   Obesity affecting pregnancy   Marijuana use during pregnancy   Nausea and vomiting in pregnancy   Chronic hypertension with superimposed preeclampsia   HELLP syndrome (HELLP), third trimester  Additional problems: HELLP    Discharge diagnosis: Preterm Pregnancy Delivered, super imposed preeclampsia with severe features, HELLP syndrome,                                       Post partum procedures:blood transfusion and postpartum tubal ligation Augmentation: N/A Complications: None  Hospital course: Admitted on 10//22 for superimposed preeclampsia with severe features, she was started on Magnesium, given Betamethasone x2, she had a PLTCS with BTL on 10/24. Magnesium continued for 24 hours after delivery. While postpartum her liver enzymes increased, ultimately she was diagnosed with HELLP, She had 2 syncopal episodes, her hemoglobin dropped to 6.5, she was given an Iron infusion and 1 unit of PRBC. Blood pressures were found to be WNL to elevated and has been treated with PO Nifedipine and Labetalol. She was started on Zoloft for concerns for developing PPD.   Magnesium Sulfate received: Yes: Seizure prophylaxis BMZ received: Yes Rhophylac:No MMR:No T-DaP:Given prenatally Flu: No RSV Vaccine received: No Transfusion:Yes Immunizations administered: Immunization History  Administered Date(s) Administered    Tdap 02/21/2023    Physical exam  Vitals:   03/12/23 1339 03/12/23 1407 03/12/23 1820 03/12/23 1933  BP: 126/84 127/83 (!) 149/92 (!) 153/95  Pulse: 81 76 80 77  Resp: 20 16    Temp: 98.3 F (36.8 C) 97.9 F (36.6 C) 98 F (36.7 C) 97.8 F (36.6 C)  TempSrc: Oral Oral Oral   SpO2: 96% 94% 99% 100%  Weight:      Height:       General: alert Lungs: CTAB Heart: RRR Lochia: appropriate Uterine Fundus: firm Incision: Dressing is clean, dry, and intact, pain pump intact DVT Evaluation: No evidence of DVT seen on physical exam. +1BLE  Labs: Lab Results  Component Value Date   WBC 13.2 (H) 03/12/2023   HGB 6.5 (L) 03/12/2023   HCT 20.4 (L) 03/12/2023   MCV 81.6 03/12/2023   PLT 313 03/12/2023      Latest Ref Rng & Units 03/12/2023    6:59 AM  CMP  Glucose 70 - 99 mg/dL 87   BUN 6 - 20 mg/dL 8   Creatinine 9.62 - 9.52 mg/dL 8.41   Sodium 324 - 401 mmol/L 141   Potassium 3.5 - 5.1 mmol/L 4.1   Chloride 98 - 111 mmol/L 108   CO2 22 - 32 mmol/L 26   Calcium 8.9 - 10.3 mg/dL 8.7   Total Protein 6.5 - 8.1 g/dL 5.7   Total Bilirubin 0.3 - 1.2 mg/dL 0.4   Alkaline Phos 38 - 126 U/L 110   AST 15 - 41 U/L 48   ALT 0 - 44 U/L 100    Edinburgh Score:  03/11/2023   10:02 AM  Edinburgh Postnatal Depression Scale Screening Tool  I have been able to laugh and see the funny side of things. 1  I have looked forward with enjoyment to things. 0  I have blamed myself unnecessarily when things went wrong. 2  I have been anxious or worried for no good reason. 2  I have felt scared or panicky for no good reason. 2  Things have been getting on top of me. 2  I have been so unhappy that I have had difficulty sleeping. 1  I have felt sad or miserable. 2  I have been so unhappy that I have been crying. 1  The thought of harming myself has occurred to me. 0  Edinburgh Postnatal Depression Scale Total 13      After visit meds:  Allergies as of 03/12/2023       Reactions    Amoxicillin Rash        Medication List     STOP taking these medications    aspirin 81 MG chewable tablet       TAKE these medications    acetaminophen 500 MG tablet Commonly known as: TYLENOL Take 2 tablets (1,000 mg total) by mouth every 6 (six) hours.   famotidine 20 MG tablet Commonly known as: PEPCID Take 1 tablet (20 mg total) by mouth daily.   ibuprofen 600 MG tablet Commonly known as: ADVIL Take 1 tablet (600 mg total) by mouth every 6 (six) hours as needed.   labetalol 200 MG tablet Commonly known as: NORMODYNE Take 1 tablet (200 mg total) by mouth 2 (two) times daily.   magnesium oxide 400 MG tablet Commonly known as: MAG-OX Take by mouth.   metoCLOPramide 10 MG tablet Commonly known as: REGLAN Take 1 tablet (10 mg total) by mouth every 8 (eight) hours as needed for nausea or vomiting.   NIFEdipine 60 MG 24 hr tablet Commonly known as: ADALAT CC Take 1 tablet (60 mg total) by mouth Nightly. What changed: when to take this   NIFEdipine 60 MG 24 hr tablet Commonly known as: ADALAT CC Take 1 tablet (60 mg total) by mouth in the morning. Start taking on: March 13, 2023 What changed:  medication strength how much to take when to take this additional instructions   NON FORMULARY Take 1 tablet by mouth daily. Pt taking Magnesium for headaches and leg cramps.  Unsure of dosage.   ondansetron 4 MG disintegrating tablet Commonly known as: ZOFRAN-ODT Take 1 tablet (4 mg total) by mouth every 6 (six) hours as needed for nausea. What changed: Another medication with the same name was removed. Continue taking this medication, and follow the directions you see here.   prenatal multivitamin Tabs tablet Take 1 tablet by mouth daily at 12 noon.   promethazine 25 MG tablet Commonly known as: PHENERGAN Take 25 mg by mouth every 6 (six) hours as needed.   sertraline 50 MG tablet Commonly known as: ZOLOFT Take 1 tablet (50 mg total) by mouth  daily. Start taking on: March 13, 2023               Discharge Care Instructions  (From admission, onward)           Start     Ordered   03/12/23 0000  Discharge wound care:       Comments: SHOWER DAILY Wash incision gently with soap and water.  Call office with any drainage, redness, or firmness of the incision.  03/12/23 1915             Discharge home in stable condition Infant Feeding: Breast Infant Disposition:NICU Discharge instruction: per After Visit Summary and Postpartum booklet. Activity: Advance as tolerated. Pelvic rest for 6 weeks.  Diet: routine diet Anticipated Birth Control: BTL done PP Postpartum Appointment:1 week Additional Postpartum F/U: Incision check 1 week Future Appointments:No future appointments. Follow up Visit:  Follow-up Information     Julieanne Manson, MD Follow up in 1 week(s).   Specialties: Obstetrics, Gynecology Why: BP and incision check Contact information: [address] [place] [address] [phone]                     03/12/2023 Ellouise Newer Elisah Parmer, CNM

## 2023-03-12 NOTE — Progress Notes (Signed)
Subjective: Postpartum Day 3: Cesarean Delivery Patient reports + flatus and no problems voiding.  Pt felt light headed and "off" while in SCN, now back in room currently receiving Iron infusion then will be given 1 unit of PRBC's. Pt doing ok, a little tearful. Desires to see her older children at home but does not want to leave her newborn either. Admits it is  "hard" but she "will be fine". Started on Zoloft yesterday. Appetite is good. Pain has been well managed. Currently pumping-expressing a good amount of milk.   Objective: Vital signs in last 24 hours: Temp:  [98.1 F (36.7 C)-98.6 F (37 C)] 98.4 F (36.9 C) (10/27 0814) Pulse Rate:  [84-113] 85 (10/27 0814) Resp:  [16-20] 18 (10/27 0814) BP: (126-157)/(70-101) 139/99 (10/27 0814) SpO2:  [99 %-100 %] 99 % (10/27 0814)  Physical Exam:  General: alert and cooperative Lochia: appropriate Uterine Fundus: firm Incision: dressing dry and intact without any drainage, redness or swelling. Pain pump present.  DVT Evaluation: No evidence of DVT seen on physical exam. Negative Homan's sign.  Recent Labs    03/11/23 0536 03/12/23 0659  HGB 7.3* 6.5*  HCT 22.4* 20.4*    Assessment/Plan: Status post Cesarean section. Postoperative course complicated by HELLP   To get Blood infusion Continue Procardia and labetalol as ordered  Discharge later today or tomorrow.   Ellouise Newer Ahmeer Tuman, CNM 03/12/2023, 10:46 AM

## 2023-03-12 NOTE — Final Progress Note (Signed)
Obstetric Postpartum Daily Progress Note Subjective:  32 y.o. [id number] postpartum day #3 status PLTCS with BTL   She is ambulating, is tolerating po, is voiding spontaneously.  Her pain is well controlled on PO pain medications. Declines script for Oxy.  Her lochia is less than menses.Reports she feels much better after the blood transfusion. She walked around the unit without difficulty. Pumping, expressing about 5 ml each time.  Mood is much improved now that she got to hold her infant in SCN. Pt desires to go home tonight. Her partner is present and they have family nearby for support.    Medications SCHEDULED MEDICATIONS   acetaminophen  1,000 mg Oral Q6H   enoxaparin (LOVENOX) injection  40 mg Subcutaneous Q24H   ibuprofen  600 mg Oral Q6H   labetalol  200 mg Oral BID   magnesium  2 g Intravenous Once   NIFEdipine  60 mg Oral q AM   NIFEdipine  60 mg Oral Nightly   pneumococcal 20-valent conjugate vaccine  0.5 mL Intramuscular Tomorrow-1000   prenatal multivitamin  1 tablet Oral Q1200   scopolamine  1 patch Transdermal Once   senna-docusate  2 tablet Oral Daily   sertraline  50 mg Oral Daily   simethicone  80 mg Oral TID PC    MEDICATION INFUSIONS   sodium chloride     naloxone HCl (NARCAN) 2 mg in dextrose 5 % 250 mL infusion     promethazine (PHENERGAN) injection (IM or IVPB)     sodium chloride      PRN MEDICATIONS  sodium chloride, albuterol, calcium carbonate, coconut oil, witch hazel-glycerin **AND** dibucaine, diphenhydrAMINE **OR** diphenhydrAMINE, diphenhydrAMINE, EPINEPHrine, hydrALAZINE **AND** hydrALAZINE **AND** labetalol **AND** labetalol **AND** Measure blood pressure, loperamide, menthol-cetylpyridinium, metoCLOPramide (REGLAN) injection, naloxone **AND** sodium chloride flush, naloxone HCl (NARCAN) 2 mg in dextrose 5 % 250 mL infusion, ondansetron (ZOFRAN) IV, oxyCODONE, promethazine (PHENERGAN) injection (IM or IVPB), simethicone, sodium chloride    Objective:    Vitals:   03/12/23 1339 03/12/23 1407 03/12/23 1820 03/12/23 1933  BP: 126/84 127/83 (!) 149/92 (!) 153/95  Pulse: 81 76 80 77  Resp: 20 16    Temp: 98.3 F (36.8 C) 97.9 F (36.6 C) 98 F (36.7 C) 97.8 F (36.6 C)  TempSrc: Oral Oral Oral   SpO2: 96% 94% 99% 100%  Weight:      Height:        Current Vital Signs 24h Vital Sign Ranges  T 97.8 F (36.6 C) Temp  Avg: 98.2 F (36.8 C)  Min: 97.8 F (36.6 C)  Max: 98.6 F (37 C)  BP (!) 153/95 BP  Min: 124/63  Max: 157/84  HR 77 Pulse  Avg: 87.1  Min: 76  Max: 113  RR 16 Resp  Avg: 17.7  Min: 16  Max: 20  SaO2 100 % Room Air SpO2  Avg: 98.2 %  Min: 94 %  Max: 100 %       24 Hour I/O Current Shift I/O  Time Ins Outs No intake/output data recorded. No intake/output data recorded.  General: NAD Pulmonary: no increased work of breathing, CTAB Abdomen: non-distended, non-tender, fundus firm at level of umbilicus Incision: dressing dry and intact, pain pump intact  Extremities: +1 edema, no erythema, no tenderness  Labs:  Recent Labs  Lab 03/10/23 0648 03/11/23 0536 03/12/23 0659  WBC 18.5* 14.5* 13.2*  HGB 7.5* 7.3* 6.5*  HCT 22.8* 22.4* 20.4*  PLT 346 332 313     Assessment:  32 y.o. [id number] postpartum day # 3 status post PLTCS with BTL   Plan:   1) Acute blood loss anemia - hemodynamically stable and asymptomatic - po ferrous sulfate  2) A POS / Rubella  / Varicella Immune  3) TDAP status given prenatally   4) breast/SCN /Contraception = bilateral tubal ligation  5) Disposition discharge today   Ellouise Newer [facility], CNM  03/12/2023 7:37 PM

## 2023-03-12 NOTE — Lactation Note (Signed)
This note was copied from a baby's chart. Lactation Consultation Note  Patient Name: Boy Aneyah Molis Coonan ONGEX'B Date: 03/12/2023 Age:32 hours Reason for consult: Follow-up assessment;Preterm <34wks (Discharge Education)   Maternal Data Patient in room resting but very encouraged because now she is getting about an ounce of pumped breastmilk she says. She informed LC that [facility] called her and she will pick up her breastpump on Monday.   Feeding Mother's Current Feeding Choice: Breast Milk and Donor Milk  Lactation Tools Discussed/Used Tools: 98F feeding tube / Syringe;Pump  Interventions Interventions: Education;CDC Guidelines for Breast Pump Cleaning  Discharge Discharge Education: Engorgement and breast care;Outpatient recommendation  Education on engorgement prevention/treatment was discussed as well as breastmilk storage guidelines.  LC provided patient with a handout on breastmilk storage guidelines from [facility]. [facility] outpatient lactation services phone number written on the white board in the room.  Patient verbalized understanding   Consult Status Consult Status: PRN Follow-up type: Call as needed    Yvette Rack Free 03/12/2023, 10:26 AM

## 2023-03-13 ENCOUNTER — Ambulatory Visit: Payer: Self-pay

## 2023-03-13 LAB — TYPE AND SCREEN
ABO/RH(D): A POS
Antibody Screen: NEGATIVE
Unit division: 0

## 2023-03-13 LAB — BPAM RBC
Blood Product Expiration Date: 202411202359
ISSUE DATE / TIME: 202410271346
Unit Type and Rh: 6200

## 2023-03-13 NOTE — Lactation Note (Signed)
This note was copied from a baby's chart. Lactation Consultation Note  Patient Name: Paula Keller WGNFA'O Date: 03/13/2023 Age:32 days Reason for consult: Follow-up assessment;Preterm <34wks;Infant < 6lbs   Maternal Data Lactation completing rounds checking in on parents and infants in SCN.  MOB and big sister present at the time of visit.  MOB very excited that she is pumping about 30ml .  Per mom she was able to pick up her Medela DEBP from [facility] today.     Feeding Mother's Current Feeding Choice: Breast Milk and Donor Milk  Lactation Tools Discussed/Used Tools: 35F feeding tube / Syringe;Pump  Interventions Interventions: DEBP  LC provided mom w/ a DEBP at the bedside of infant for mom to pump while visiting baby. Encouraged mom to reach out to lactation if she has questions or concerns. Mom verbalized understanding.   Consult Status Consult Status: NICU follow-up Follow-up type: Call as needed    Yvette Rack Free 03/13/2023, 5:21 PM

## 2023-03-15 ENCOUNTER — Ambulatory Visit: Payer: Medicaid Other | Admitting: Obstetrics

## 2023-03-15 VITALS — BP 120/80 | HR 102 | Ht 64.0 in | Wt 250.0 lb

## 2023-03-15 DIAGNOSIS — Z9851 Tubal ligation status: Secondary | ICD-10-CM

## 2023-03-15 DIAGNOSIS — Z98891 History of uterine scar from previous surgery: Secondary | ICD-10-CM

## 2023-03-15 DIAGNOSIS — Z4889 Encounter for other specified surgical aftercare: Secondary | ICD-10-CM

## 2023-03-15 NOTE — Progress Notes (Signed)
[facility]  1 WEEK POST-OP INCISION CHECK PROGRESS NOTE  Subjective:  PCP: [facility]  Patient ID: Paula Keller, female    DOB: Nov 27, 1990, 32 y.o.   MRN: [id number]  HPI  Patient is a 33 y.o. [phone] female who presents for incision check from her C-section with bilateral tubal ligation on 03/09/23 at [redacted]w[redacted]d due to SIPE with severe features. She reports her wound vac has loosened on the edges and she has a piece of tape on it, which helped the pump reset. Was sent home on Procardia 60mg  BID, today denies HA, vision changes, RUQ pain, and Bps have been well controlled at home. No bleeding or exudate from the bandage, is moving around well, baby still in NICU but doing well. They are going to see him right after visit today. Is pain-controlled, stooling and voiding normally, no acute concerns.   The following portions of the patient's history were reviewed and updated as appropriate: allergies, current medications, past family history, past medical history, past social history, past surgical history, and problem list.  Review of Systems Pertinent items are noted in HPI.   Objective:   Blood pressure 120/80, pulse (!) 102, height 5\' 4"  (1.626 m), weight 250 lb (113.4 kg), currently breastfeeding. Body mass index is 42.91 kg/m.  General appearance: alert, cooperative, and morbidly obese Abdomen: soft, non-tender; bowel sounds normal; no masses,  no organomegaly; Incision well-approximated, C/D/I, new steristrips placed. No seroma, hematoma, redness, exudate, or skin rash.  Pelvic: deferred Extremities: extremities normal, atraumatic, no cyanosis or edema Neurologic: Grossly normal  Assessment/Plan:   1. Postoperative visit   2. S/P cesarean section   3. S/P tubal ligation    32yo G3 now P2103 s/p pLTCS w/BTL at 302d for SIPE with severe features, now POD#6 here today for an early incision check due to wound vac coming off early, and is doing well. No concerns with incision  today, wound vac completely removed and new steristrips placed.  -Discussed at-home care, healing expectations, si/sx of infection -Avoid vigorous scrubbing/washing of incision site; hygiene reviewed -Call clinic if developing redness, discharge, increasing pain. -May resume driving and light walking, still no heavy lifting > 10lbs and pelvic rest until cleared at PPV -If stooling regularly x 1-2 weeks, can take stool softener every other day x 1 week and taper as tolerated.  -RTC 5wks for postpartum visit, sooner prn   Julieanne Manson, DO [facility]

## 2023-03-16 ENCOUNTER — Ambulatory Visit: Payer: Medicaid Other | Admitting: Obstetrics

## 2023-03-17 ENCOUNTER — Telehealth (INDEPENDENT_AMBULATORY_CARE_PROVIDER_SITE_OTHER): Payer: Medicaid Other | Admitting: Obstetrics

## 2023-03-17 ENCOUNTER — Ambulatory Visit: Payer: Self-pay

## 2023-03-17 ENCOUNTER — Telehealth: Payer: Self-pay

## 2023-03-17 ENCOUNTER — Encounter: Payer: Self-pay | Admitting: Obstetrics

## 2023-03-17 DIAGNOSIS — Z1332 Encounter for screening for maternal depression: Secondary | ICD-10-CM

## 2023-03-17 DIAGNOSIS — F53 Postpartum depression: Secondary | ICD-10-CM | POA: Insufficient documentation

## 2023-03-17 MED ORDER — HYDROXYZINE HCL 25 MG PO TABS: 25.0000 mg | ORAL_TABLET | Freq: Four times a day (QID) | ORAL | 2 refills | Status: AC | PRN

## 2023-03-17 NOTE — Telephone Encounter (Signed)
TRIAGE VOICEMAIL: Patient would like to talk to someone about postpartum emotions.

## 2023-03-17 NOTE — Lactation Note (Signed)
This note was copied from a baby's chart. Lactation Consultation Note  Patient Name: Paula Keller ONGEX'B Date: 03/17/2023 Age:32 days Reason for consult: Follow-up assessment;Preterm <34wks;Infant < 6lbs   Maternal Data Lactation called to bedside to possibly assist w/ putting infant to the a dry breast.  During this visit, MOB stated that her right breast has a blister at the tip and hurts terribly bad.  Mom stated that the flange was feeling uncomfortable as well.   1230 During this visit, mom was pumping in SCN.  And had been pumping for 30 minutes. She stated the pump did not stop on its on.  Per mom she is pressing start and then initiate.  Mom said that the new flange size and the lanolin has made the blister feel a whole lot better.  Previously she stated she could barely pump because of pain.    Feeding Mother's Current Feeding Choice: Breast Milk and Donor Milk  Lactation Tools Discussed/Used Tools: 54F feeding tube / Syringe  Interventions Interventions: Shells;Education  1045 LC assessed patients nipples.  Right breast does indeed have some damage at the tip.  LC discussed with MOB about different types of nipple care.  Lanolin was provided to mom as well as a bigger flange size, 27mm.  Breast shells were given to mom for the right breast as she stated the breast pads hurt over the nipple.    1230 MOBs nipple was assessed after the pump session.  Moms nipples swell a more in the 27mm flange.  A clear blister was at the tip of the nipple. LC stated to continue to observe her nipples and use cream to help .    Lactation will continue to check in with mom to make sure nipple heals appropriately.   Consult Status Consult Status: Follow-up Follow-up type: In-patient    Yvette Rack Free 03/17/2023, 4:09 PM

## 2023-03-17 NOTE — Progress Notes (Signed)
Virtual Visit via Video Note  I connected with Paula Keller on 03/17/23 at  3:15 PM EDT by a video enabled telemedicine application and verified that I am speaking with the correct person using two identifiers.  Location: Patient: home Provider: office   I discussed the limitations of evaluation and management by telemedicine and the availability of in person appointments. The patient expressed understanding and agreed to proceed.  History of Present Illness: Paula Keller is a 32 y.o. [phone] who is 8 days PP following a preterm CS for SIPE with severe features. Her baby is still in the NICU. She has been feeling anxious and overwhelmed and cannot stop crying. She is very concerned about PPD.  Observations/Objective: -Paula Keller is tearful during this visit and expresses feelings of overwhelm and anxiety. She is only 8 days s/p CS. Her baby is making progress but it is hard to be away from him. She spends most of her time at the hospital, but she is also responsible for driving her partner to work and picking up her other children. She has limited opportunity for sleep and her appetite has been poor. She is experiencing nipple pain from pumping. She is particularly distressed by a setback this afternoon in which she was prepared to have the baby go to breast, but at the last minute he was breathing too fast and was not allowed.  Assessment and Plan: PPD *Medication:  -Continue sertraline 50 mg. Discussed that it may take 4-6 weeks to see improvement from this medication, and that she may need an increased dose over time -Rx sent for hydroxyzine 25 mg PRN. This has helped [facility] in the past *Physiologic needs: -Encouraged a minimum of 5 hours of uninterrupted sleep each night and make this a priority -Eat regular meals or at least snack frequently if appetite is poor -Stay adequately hydrated -Continue self-care and respect physical limitations from recovery from recent  surgery *Social support: -Partner and parents identified as sources of support -Link sent for online NICU parent support groups -Encouraged to validate and discuss how challenging her current situation is and that it is normal to feel overwhelmed. Look for assistance in tasks, food prep, etc. We discussed that she has an unusually large amount of stress and responsibility in addition to recovering from a difficult birth, and it is expected that she will not always be able to cope well, and she is making a good choice by seeking help. *Counseling: -Message sent to Gayland Curry to help coordinate care -Links to [facility] resources sent via MyChart *Follow-up: -Mood check in one week -Instructed to reach out again if she feels she is worsening or has SI/HI  Follow Up Instructions:  I discussed the assessment and treatment plan with the Paula Keller. She was provided an opportunity to ask questions and all were answered. She agreed with the plan and demonstrated an understanding of the instructions.   She was advised to call back or seek an in-person evaluation if the symptoms worsen or if the condition fails to improve as anticipated.  I provided 15 minutes of non-face-to-face time during this encounter.   Glenetta Borg, CNM

## 2023-03-17 NOTE — Telephone Encounter (Signed)
My chart video visit scheduled now w/Melissa Swanson. Patient aware. She will await link to start visit.

## 2023-03-20 ENCOUNTER — Other Ambulatory Visit: Payer: Medicaid Other

## 2023-03-20 ENCOUNTER — Ambulatory Visit: Payer: Self-pay

## 2023-03-20 NOTE — Lactation Note (Addendum)
This note was copied from a baby's chart. Lactation Consultation Note  Patient Name: Paula Keller ZOXWR'U Date: 03/20/2023 Age:32 days Reason for consult: Follow-up assessment;Preterm <34wks   Maternal Data LC met w/ parents at the bedside in NICU.  MOB is concerned that her volume of milk has decreased drastically .  Per mom she stated she barely fills the curve of the bottle up now with milk compared to before.  MOB verbalized to [facility] that she is interested in knowing what she can do to increase her milk supply.  Per mom she is trying to pump at least 8x w/in a 24hr period but may not be doing that.  Mom stated that she is stressed and trying to care for her other kids at home.   Per mom she pumps at 11pm and then doesn't pump again until the 6:30am pump session, which per FOB she gets the most milk. Per mom she does have pictures of baby Paula and videos to try that she can look at when she is home.  Feeding Mother's Current Feeding Choice: Breast Milk and Donor Milk  Lactation Tools Discussed/Used Tools: 79F feeding tube / Syringe  Interventions Interventions: Education;Breast feeding basics reviewed  LC reiterated the importance of the pumping at least 8x w/in a 24hr period.  LC suggested to try power pumping, and provided education on what that is.  Some short articles were given to mom on power pumping. Mom thanked [facility] for information.    [facility] & Dr. reminded mom the importance of eating, sleeping and staying hydrated but also praised her for all she is doing because it is hard work.  Recommended that mom try pumping when at the hospital after hold infant, but per mom they normally have to leave immediately and dont have the time.  Lactation will continue to check in with mom on the pumping journey.    Consult Status Consult Status: NICU follow-up Follow-up type: Call as needed    Paula Keller 03/20/2023, 12:16 PM

## 2023-03-22 ENCOUNTER — Telehealth: Payer: Medicaid Other | Admitting: Obstetrics

## 2023-03-22 NOTE — Progress Notes (Signed)
Virtual Visit via Video Note  I connected with NAME@ on 03/22/23 at  10:55 AM EST by a video enabled telemedicine application and verified that I am speaking with the correct person using two identifiers.  Location: Patient: at home Provider: [facility]   I discussed the limitations of evaluation and management by telemedicine and the availability of in person appointments. The patient expressed understanding and agreed to proceed.    History of Present Illness:   Paula Keller is a 32 y.o. [phone] female who presents for a 2 week televisit for mood check. She is 2 weeks postpartum following a cesarean delivery.  The delivery was at [redacted]w[redacted]d gestational weeks.  Postpartum course has been well so far. Baby is feeding by breast milk .pt is pumping and taking it to the hospital. Bleeding: almost gone . Postpartum depression screening: negative.  EDPS score is 6.      The following portions of the patient's history were reviewed and updated as appropriate: allergies, current medications, past family history, past medical history, past social history, past surgical history, and problem list.   Observations/Objective:   currently breastfeeding. Gen App: NAD Psych: normal speech, affect. Good mood.        03/17/2023    3:15 PM 03/11/2023   10:02 AM  Edinburgh Postnatal Depression Scale Screening Tool  I have been able to laugh and see the funny side of things. 1 1  I have looked forward with enjoyment to things. 2 0  I have blamed myself unnecessarily when things went wrong. 2 2  I have been anxious or worried for no good reason. 3 2  I have felt scared or panicky for no good reason. 3 2  Things have been getting on top of me. 2 2  I have been so unhappy that I have had difficulty sleeping. 1 1  I have felt sad or miserable. 1 2  I have been so unhappy that I have been crying. 3 1  The thought of harming myself has occurred to me. 0 0  Edinburgh Postnatal  Depression Scale Total 18 13    Assessment and Plan:   1. Encounter for screening for maternal depression - Screening Positive today. No SI/HI. Pt aware of Angie's contact with SW who is working on coordinating counseling for pt and will reach out to patient directly.  - Continue Sertraline daily.  - Will rescreen at 6 week postpartum visit. Overall doing well.    2. Postpartum state - Overall doing well. Continue routine postpartum home care.    3. Lactating mother - Is pumping for baby in NICU, discussed that if this becomes too much and overwhelming in setting of her moods, she should consider cutting back.    Follow Up Instructions:    I discussed the assessment and treatment plan with the patient. The patient was provided an opportunity to ask questions and all were answered. The patient agreed with the plan and demonstrated an understanding of the instructions.   The patient was advised to call back or seek an in-person evaluation if the symptoms worsen or if the condition fails to improve as anticipated.  This visit was conducted via Telehealth w/auditory and visual contact. Pt was at her home and Dr. Lonny Prude in the [facility]. Total time was 15 minutes. That includes chart review before the visit, the actual patient visit, and time spent on documentation after the visit.     Julieanne Manson, DO [facility]

## 2023-03-24 ENCOUNTER — Ambulatory Visit: Payer: Self-pay

## 2023-03-24 NOTE — Lactation Note (Signed)
This note was copied from a baby's chart. Lactation Consultation Note  Patient Name: Paula Keller Today's Date: 03/24/2023 Age:32 wk.o.    Maternal Data  LC checked in w/ [redacted]w[redacted]d old baby Paula and parents.  Mom stated that she is still attempting to pump but her milk has only come up a small amt.  LC informed mom to make sure she is putting her mental health first when it comes to this whole process.  LC informed mom she doesn't want her to stress if this is becoming to much for her and we are here to help talk out any suggestions. Mom verbalized understanding.     Yvette Rack Arkie Tagliaferro 03/24/2023, 2:20 PM

## 2023-03-27 ENCOUNTER — Other Ambulatory Visit: Payer: Medicaid Other

## 2023-03-30 ENCOUNTER — Ambulatory Visit: Payer: Self-pay

## 2023-03-30 NOTE — Lactation Note (Signed)
This note was copied from a baby's chart. Lactation Consultation Note  Patient Name: Paula Keller Today's Date: 03/30/2023 Age:32 wk.o.     Maternal Data Lactation called to bedside of baby Paula Paula Keller.  Infant is 85wks old.    Feeding Infant in football hold next to mom on the rt breast.  MOB stated this breast produces more milk than the other side.  The experience at the breast was more of a lick-n-learn experience.  Infant did open mouth at nipple but did not actively feed.  He would provide some sucks and stop, just holding nipple in the mouth. After about 10 minutes, infant stopped to just rest.  Infant was placed STS.  Lactation Tools Discussed/Used Tools: 57F feeding tube / Syringe     Syvilla Martin S Jessicamarie Amiri 03/30/2023, 4:47 PM

## 2023-03-31 ENCOUNTER — Ambulatory Visit: Payer: Self-pay

## 2023-03-31 NOTE — Lactation Note (Signed)
This note was copied from a baby's chart. Lactation Consultation Note  Patient Name: Paula Keller Today's Date: 03/31/2023 Age:32 wk.o.   Mother of baby Paula "Paula Keller" asked to speak with lactation.  MOB was doing STS at bedside with infant.  MOB pointed out 15ml of breastmilk that she pumped.  She asked LC would it be ok to provide breastmilk in a bottle and follow up with formula instead of trying to put infant to the breast.  Mom stated that he is doing so well and I dont want to slow him up and I am actually very ok with providing my milk in a bottle to him.  LC informed mom this decision is just fine.  If it makes her feel good to see what she is providing her son, yes this is something that she can do.    Lactation Tools Discussed/Used Tools: 76F feeding tube / Syringe      Pamela Maddy S Bernardette Waldron 03/31/2023, 3:17 PM

## 2023-04-03 ENCOUNTER — Other Ambulatory Visit: Payer: Medicaid Other

## 2023-04-10 ENCOUNTER — Ambulatory Visit: Payer: Self-pay

## 2023-04-10 NOTE — Lactation Note (Signed)
This note was copied from a baby's chart. Lactation Consultation Note  Patient Name: Paula Keller [id number]'[id number] Date: 04/10/2023 Age:32 wk.o.     Maternal Data LC at bedside to w/ both parents and infant feeding at the breast.  Mom stated that infant feeds better when a nipple shield is placed at the breast.  Mom inquired to [facility] about using a SNS device for feeding since baby does so well at the breast and wanted to lactations thoughts.   Feeding Infant feeding on the rt breast in football hold upon visitation w/ family.  Mom pumped an hr before this feeding.  Infant actively feeding at the breast w/ a nipple shield.   LATCH Score Latch: Repeated attempts needed to sustain latch, nipple held in mouth throughout feeding, stimulation needed to elicit sucking reflex.  Audible Swallowing: A few with stimulation  Type of Nipple: Everted at rest and after stimulation  Comfort (Breast/Nipple): Soft / non-tender  Hold (Positioning): No assistance needed to correctly position infant at breast.  LATCH Score: 8  Lactation Tools Discussed/Used Tools: 24F feeding tube / Syringe  Interventions LC informed mom how a feeding with a SNS works.  LC stated that the pros and cons to using a SNS, and recommended if she was to go this route that it would need to be done in the presence of an LC or RN to make sure the flow rate of the milk doesn't overwhelm infant.  LC reminded mom that we want the feeding at the breast to be positive and we want infant to not be overwhelmed by volume and how fast the milk can come out.  Mom verbalized understanding.      Paula Keller 04/10/2023, 12:28 PM

## 2023-04-15 ENCOUNTER — Ambulatory Visit: Payer: Self-pay

## 2023-04-15 NOTE — Lactation Note (Signed)
This note was copied from a baby's chart. Lactation Consultation Note  Patient Name: Paula Keller Today's Date: 04/15/2023 Age:32 wk.o. Reason for consult: Follow-up assessment;NICU baby;Preterm <34wks   Maternal Data    Feeding Mother's Current Feeding Choice: Breast Milk Nipple Type: (P) Dr. Cline Crock Mom here to breastfeed, baby tired, increased resp and wob, latched to shield x 2 but only a few suckles, breastfeeding session ended and baby skin to skin on mom's chest LATCH Score Latch: Repeated attempts needed to sustain latch, nipple held in mouth throughout feeding, stimulation needed to elicit sucking reflex.  Audible Swallowing: A few with stimulation  Type of Nipple: Everted at rest and after stimulation  Comfort (Breast/Nipple): Soft / non-tender  Hold (Positioning): No assistance needed to correctly position infant at breast.  LATCH Score: 8   Lactation Tools Discussed/Used Tools: Nipple Shields Nipple shield size: 20  Interventions    Discharge    Consult Status Consult Status: PRN Date: 04/16/23 Follow-up type: In-patient    Dyann Kief 04/15/2023, 2:57 PM

## 2023-04-25 ENCOUNTER — Ambulatory Visit (INDEPENDENT_AMBULATORY_CARE_PROVIDER_SITE_OTHER): Payer: Medicaid Other | Admitting: Obstetrics

## 2023-04-25 NOTE — Patient Instructions (Signed)
Here is the link to postpartum exercises, try to do them 3-5 times per week! [email or web address]

## 2023-04-25 NOTE — Progress Notes (Signed)
   OBSTETRICS POSTPARTUM CLINIC PROGRESS NOTE  Subjective:     Belkis Reeve is a 32 y.o. [phone] female who presents for a postpartum visit. She is 6 week postpartum following a low cervical transverse Cesarean section. I have reviewed the prenatal and intrapartum course. The delivery was at [facility] days gestational weeks.  Anesthesia: spinal. Postpartum course has been normal. Baby's course has been at hospital. Pecola Leisure is feeding by breast milk (pumping). Bleeding: patient has not resumed menses, with Patient's last menstrual period was 04/18/2023.Marland Kitchen Bowel function is normal. Bladder function is normal. Patient is not sexually active. Contraception method desired is tubal ligation. Postpartum depression screening: negative.  EDPS score is 4.   Pap-10/18/22 hpv negative  The following portions of the patient's history were reviewed and updated as appropriate: allergies, current medications, past family history, past medical history, past social history, past surgical history, and problem list.  Review of Systems Pertinent items are noted in HPI.   Objective:    BP 111/63   Pulse 82   Ht 5\' 4"  (1.626 m)   Wt 260 lb (117.9 kg)   LMP 04/18/2023   Breastfeeding Yes   BMI 44.63 kg/m   General:  alert and no distress   Breasts:  inspection negative, no nipple discharge or bleeding, no masses or nodularity palpable  Lungs: clear to auscultation bilaterally  Heart:  regular rate and rhythm, S1, S2 normal, no murmur, click, rub or gallop  Abdomen: soft, non-tender; bowel sounds normal; no masses,  no organomegaly.  Well healed Pfannenstiel incision. Some superficial dermatitis.    Vulva:  normal  Vagina: normal vagina, no discharge, exudate, lesion, or erythema  Cervix:  no cervical motion tenderness and no lesions  Corpus: normal size, contour, position, consistency, mobility, non-tender  Adnexa:  normal adnexa and no mass, fullness, tenderness  Rectal Exam: Not performed.          Labs:  Lab Results  Component Value Date   HGB 6.5 (L) 03/12/2023    Assessment:   1. Postpartum care and examination      Plan:  Ryker Provenzano is a 31 y.o. [phone] s/p pLTCS at [redacted]w[redacted]d after developing SIPE, no complications. Today is doing well. EPDS=4.  -Contraception: s/p BTL -Okay to resume all regular activity as tolerated-link to postpartum exercises in AVS today. Do exercises 3-5 times weekly, daily if able.  -Reviewed returning to intercourse expectations. -Hair loss and pelvic floor function expectations discussed. -Continue PNV as daily multivitamin. -Moods reviewed, alert clinic if developing PPD/A -cHTN: see PCP in the next month to establish care for HTN -RTC for WWE in 12 months, sooner if concerns.   Julieanne Manson DO [facility]

## 2023-12-02 NOTE — Discharge Summary (Signed)
 ------------------------------------------------------------------------------- Attestation signed by Lena Harlene Massa, MD at 12/02/23 1631 ______________________________________________________ ATTENDING PHYSICIAN  I was the supervising physician in the service of this patient. I was available to assit. I did not see the patient on the day of discharge. [name], MD  -------------------------------------------------------------------------------   Physician Discharge Summary [facility] OBSERVATION [facility] [address] [place] [address] Dept: [phone] Loc: [phone]   Identifying Information:  Paula Keller Jan 15, 1991 [id number]  Primary Care Physician: [facility]  Code Status: Full Code  Admit Date: 12/01/2023  Discharge Date: 12/02/2023   Discharge To: Home  Discharge Service: [facility] - Hospitalist [facility] APP   Discharge Attending Physician: Johanne Gong, FNP  Discharge Diagnoses: Active Problems:   * No active hospital problems. * Resolved Problems:   * No resolved hospital problems. *   Outpatient Provider Follow Up Issues:  - Follow-up with the PCP as needed  Hospital Course: Paula Keller is a 33 y.o. female with past medical history of hypertension, mood disorder, migraine headaches who presents with abdominal pain, vomiting.  Mild acute pancreatitis: Patient had presented to outside hospital with similar symptoms 2 days prior to this admission, CT revealed Groove pancreatitis, evidenced by mild inflammatory changes.  Normal lipase. At that time patient was able to tolerate p.o. after symptomatic management so she was discharged.  However, represented this time with worsening pain with associated nausea and vomiting.  Labs on admission with very mild leukocytosis, elevated triglycerides 326, normal lipase of 32.  Leukocytosis resolved on recheck labs.  Remained afebrile on  admission through the stay.  Additional workup with normal B12 and folate level.  Received symptomatic management with IV fluids, nausea medication and pain medication.  Noted significant improvement with symptoms.  Tolerated full liquid diet followed by regular diet.  Denies any abdominal pain after meal.  Initial plan was to keep her 1 more day in the hospital while she is on regular diet to make sure her symptoms are not recurring.  However, patient requested to be discharged home.  Given symptom improvement, discharged home to follow-up closely with the PCP if symptoms reoccurs.   HTN: Hypertensive on admission, in the setting of moderate severe pain.  Continued home regimen while inpatient and at discharge.   Mood disorder - patient state she is currently controlled off medications   Tobacco use: Received NRT while inpatient.  Declined nicotine  patch on discharge.  Counseled patient on smoking cessation as smoking will make pancreatitis worse.     Procedures: None No admission procedures for hospital encounter. ______________________________________________________________________ Discharge Medications:   Your Medication List     STOP taking these medications    clindamycin 300 MG capsule Commonly known as: CLEOCIN   fluvoxamine  100 MG tablet Commonly known as: LUVOX    hydroCHLOROthiazide  25 MG tablet Commonly known as: HYDRODIURIL        CONTINUE taking these medications    albuterol  90 mcg/actuation inhaler Commonly known as: PROVENTIL  HFA;VENTOLIN  HFA Inhale 2 puffs every four (4) hours as needed for wheezing.   amitriptyline 25 MG tablet Commonly known as: ELAVIL TAKE 1 TABLET DAILY AT BEDTIME FOR MIGRAINE   aripiprazole  10 MG tablet Commonly known as: ABILIFY  Take 10 mg by mouth in the morning.   cyclobenzaprine 5 MG tablet Commonly known as: FLEXERIL Take 1 tablet (5 mg total) by mouth two (2) times a day as needed for muscle spasms.    doxylamine-pyridoxine (vit B6) 10-10 mg  tablet Commonly known as: DICLEGIS 2 tabs first 2 nights. May use 3 tablets 3rd night if needed, and ultimately 4 tablets max on 4th night   hydrOXYzine  10 MG tablet Commonly known as: ATARAX  Take 10 mg by mouth Three (3) times a day as needed for itching.   lisinopril-hydroCHLOROthiazide  20-12.5 mg per tablet Commonly known as: PRINZIDE,ZESTORETIC Take 1 tablet by mouth daily.   NIFEdipine  30 MG 24 hr tablet Commonly known as: PROCARDIA  XL Take 1 tablet (30 mg total) by mouth daily.   ondansetron  4 MG disintegrating tablet Commonly known as: ZOFRAN -ODT Take 1 tablet (4 mg total) by mouth every eight (8) hours as needed for nausea for up to 7 days.        Allergies: Amoxicillin and Prednisone  ______________________________________________________________________ Pending Test Results (if blank, then none):   Most Recent Labs: All lab results last 24 hours -  Recent Results (from the past 24 hours)  hCG QUANTitative, Blood   Collection Time: 12/01/23  5:24 PM  Result Value Ref Range   hCG Quantitative <2.6 mIU/mL  Lipase   Collection Time: 12/01/23  5:24 PM  Result Value Ref Range   Lipase 32 12 - 53 U/L  Comprehensive Metabolic Panel   Collection Time: 12/01/23  5:24 PM  Result Value Ref Range   Sodium 139 135 - 145 mmol/L   Potassium 4.0 3.4 - 4.8 mmol/L   Chloride 105 98 - 107 mmol/L   CO2 27.0 20.0 - 31.0 mmol/L   Anion Gap 7 5 - 14 mmol/L   BUN 8 (L) 9 - 23 mg/dL   Creatinine 9.37 9.44 - 1.02 mg/dL   BUN/Creatinine Ratio 13    eGFR CKD-EPI (2021) Female >90 >=60 mL/min/1.46m2   Glucose 88 70 - 179 mg/dL   Calcium  10.1 8.7 - 10.4 mg/dL   Albumin 4.3 3.4 - 5.0 g/dL   Total Protein 7.9 5.7 - 8.2 g/dL   Total Bilirubin 0.4 0.3 - 1.2 mg/dL   AST 19 <=65 U/L   ALT 18 10 - 49 U/L   Alkaline Phosphatase 126 (H) 46 - 116 U/L  CBC w/ Differential   Collection Time: 12/01/23  5:24 PM  Result Value Ref Range   WBC 11.7  (H) 3.6 - 11.2 10*9/L   RBC 5.49 (H) 3.95 - 5.13 10*12/L   HGB 12.5 11.3 - 14.9 g/dL   HCT 62.1 65.9 - 55.9 %   MCV 68.8 (L) 77.6 - 95.7 fL   MCH 22.8 (L) 25.9 - 32.4 pg   MCHC 33.2 32.0 - 36.0 g/dL   RDW 83.6 (H) 87.7 - 84.7 %   MPV 7.4 6.8 - 10.7 fL   Platelet 423 150 - 450 10*9/L   Neutrophils % 81.4 %   Lymphocytes % 13.3 %   Monocytes % 4.2 %   Eosinophils % 0.7 %   Basophils % 0.4 %   Absolute Neutrophils 9.5 (H) 1.8 - 7.8 10*9/L   Absolute Lymphocytes 1.5 1.1 - 3.6 10*9/L   Absolute Monocytes 0.5 0.3 - 0.8 10*9/L   Absolute Eosinophils 0.1 0.0 - 0.5 10*9/L   Absolute Basophils 0.0 0.0 - 0.1 10*9/L   Microcytosis Moderate (A) Not Present   Anisocytosis Slight (A) Not Present   Hypochromasia Marked (A) Not Present  Morphology Review   Collection Time: 12/01/23  5:24 PM  Result Value Ref Range   Smear Review Comments See Comment Undefined   Toxic Vacuolation Present (A) Not Present  Comprehensive Metabolic Panel  Collection Time: 12/02/23  4:40 AM  Result Value Ref Range   Sodium 141 135 - 145 mmol/L   Potassium 3.7 3.4 - 4.8 mmol/L   Chloride 105 98 - 107 mmol/L   CO2 29.0 20.0 - 31.0 mmol/L   Anion Gap 7 5 - 14 mmol/L   BUN 6 (L) 9 - 23 mg/dL   Creatinine 9.26 9.44 - 1.02 mg/dL   BUN/Creatinine Ratio 8    eGFR CKD-EPI (2021) Female >90 >=60 mL/min/1.8m2   Glucose 81 70 - 179 mg/dL   Calcium  9.6 8.7 - 10.4 mg/dL   Albumin 3.8 3.4 - 5.0 g/dL   Total Protein 6.6 5.7 - 8.2 g/dL   Total Bilirubin 0.4 0.3 - 1.2 mg/dL   AST 13 <=65 U/L   ALT 14 10 - 49 U/L   Alkaline Phosphatase 110 46 - 116 U/L  Magnesium  Level   Collection Time: 12/02/23  4:40 AM  Result Value Ref Range   Magnesium  1.8 1.6 - 2.6 mg/dL  Phosphorus Level   Collection Time: 12/02/23  4:40 AM  Result Value Ref Range   Phosphorus 3.5 2.4 - 5.1 mg/dL  CBC w/ Differential   Collection Time: 12/02/23  4:40 AM  Result Value Ref Range   WBC 9.2 3.6 - 11.2 10*9/L   RBC 5.04 3.95 - 5.13 10*12/L    HGB 11.6 11.3 - 14.9 g/dL   HCT 65.1 65.9 - 55.9 %   MCV 69.0 (L) 77.6 - 95.7 fL   MCH 22.9 (L) 25.9 - 32.4 pg   MCHC 33.2 32.0 - 36.0 g/dL   RDW 84.1 (H) 87.7 - 84.7 %   MPV 7.0 6.8 - 10.7 fL   Platelet 353 150 - 450 10*9/L   Neutrophils % 66.2 %   Lymphocytes % 26.2 %   Monocytes % 6.0 %   Eosinophils % 1.2 %   Basophils % 0.4 %   Absolute Neutrophils 6.1 1.8 - 7.8 10*9/L   Absolute Lymphocytes 2.4 1.1 - 3.6 10*9/L   Absolute Monocytes 0.6 0.3 - 0.8 10*9/L   Absolute Eosinophils 0.1 0.0 - 0.5 10*9/L   Absolute Basophils 0.0 0.0 - 0.1 10*9/L   Microcytosis Moderate (A) Not Present   Hypochromasia Marked (A) Not Present  Vitamin B12 Level   Collection Time: 12/02/23  9:44 AM  Result Value Ref Range   Vitamin B-12 573 211 - 911 pg/ml  Folate Level   Collection Time: 12/02/23  9:44 AM  Result Value Ref Range   Folate 20.7 >=5.4 ng/mL    Relevant Studies/Radiology (if blank, then none):  ______________________________________________________________________ Discharge Instructions:     Follow Up instructions and Outpatient Referrals    Discharge instructions       Other Instructions     Discharge instructions     - Nausea, vomiting improved.  Tolerated regular diet well prior to discharge. -Denied any abdominal pain or discomfort after eating.  Given symptom improvement, will discharge home to follow-up with the PCP closely. -We did not make any changes to your home medications while in the hospital. -Continue all your home medications as prior.        ______________________________________________________________________ Discharge Day Services: BP 176/115   Pulse 63   Temp 36.4 C (97.5 F) (Oral)   Resp 16   Ht 165.1 cm (5' 5)   Wt (!) 120.2 kg (265 lb)   LMP 11/22/2023 (Approximate)   SpO2 98%   BMI 44.10 kg/m  Pt seen on the day of  discharge and determined appropriate for discharge.  Condition at Discharge: good  Length of Discharge: I spent  greater than 30 mins in the discharge of this patient.

## 2023-12-11 ENCOUNTER — Other Ambulatory Visit (HOSPITAL_BASED_OUTPATIENT_CLINIC_OR_DEPARTMENT_OTHER): Payer: Self-pay | Admitting: Internal Medicine

## 2023-12-11 DIAGNOSIS — K859 Acute pancreatitis without necrosis or infection, unspecified: Secondary | ICD-10-CM

## 2023-12-11 DIAGNOSIS — R1013 Epigastric pain: Secondary | ICD-10-CM

## 2023-12-20 ENCOUNTER — Other Ambulatory Visit (HOSPITAL_BASED_OUTPATIENT_CLINIC_OR_DEPARTMENT_OTHER): Admitting: Radiology
